# Patient Record
Sex: Male | Born: 1945
Health system: Southern US, Community
[De-identification: ages and names within clinical notes are randomized; demographics above are authoritative.]

## PROBLEM LIST (undated history)

## (undated) DIAGNOSIS — R972 Elevated prostate specific antigen [PSA]: Secondary | ICD-10-CM

## (undated) DIAGNOSIS — E079 Disorder of thyroid, unspecified: Secondary | ICD-10-CM

## (undated) DIAGNOSIS — G4700 Insomnia, unspecified: Secondary | ICD-10-CM

## (undated) DIAGNOSIS — T7840XA Allergy, unspecified, initial encounter: Secondary | ICD-10-CM

## (undated) DIAGNOSIS — E785 Hyperlipidemia, unspecified: Secondary | ICD-10-CM

## (undated) DIAGNOSIS — K900 Celiac disease: Secondary | ICD-10-CM

## (undated) DIAGNOSIS — J45909 Unspecified asthma, uncomplicated: Secondary | ICD-10-CM

## (undated) DIAGNOSIS — I10 Essential (primary) hypertension: Secondary | ICD-10-CM

## (undated) DIAGNOSIS — N189 Chronic kidney disease, unspecified: Secondary | ICD-10-CM

## (undated) HISTORY — DX: Allergy, unspecified, initial encounter: T78.40XA

## (undated) HISTORY — DX: Celiac disease: K90.0

## (undated) HISTORY — DX: Hyperlipidemia, unspecified: E78.5

## (undated) HISTORY — PX: COLONOSCOPY: SHX174

## (undated) HISTORY — DX: Disorder of thyroid, unspecified: E07.9

## (undated) HISTORY — DX: Essential (primary) hypertension: I10

## (undated) HISTORY — PX: EYE SURGERY: SHX253

## (undated) HISTORY — PX: UPPER GASTROINTESTINAL ENDOSCOPY: SHX188

## (undated) HISTORY — DX: Unspecified asthma, uncomplicated: J45.909

## (undated) HISTORY — DX: Chronic kidney disease, unspecified: N18.9

## (undated) HISTORY — DX: Insomnia, unspecified: G47.00

## (undated) HISTORY — DX: Elevated prostate specific antigen (PSA): R97.20

---

## 2004-03-13 ENCOUNTER — Encounter: Payer: Self-pay | Admitting: Gastroenterology

## 2005-05-28 ENCOUNTER — Ambulatory Visit: Payer: Self-pay | Admitting: Gastroenterology

## 2005-06-03 ENCOUNTER — Ambulatory Visit (HOSPITAL_COMMUNITY): Admission: RE | Admit: 2005-06-03 | Discharge: 2005-06-03 | Payer: Self-pay | Admitting: Gastroenterology

## 2005-06-03 ENCOUNTER — Ambulatory Visit: Payer: Self-pay | Admitting: Gastroenterology

## 2005-06-17 ENCOUNTER — Encounter: Payer: Self-pay | Admitting: Gastroenterology

## 2005-06-17 ENCOUNTER — Ambulatory Visit (HOSPITAL_COMMUNITY): Admission: RE | Admit: 2005-06-17 | Discharge: 2005-06-17 | Payer: Self-pay | Admitting: Gastroenterology

## 2005-07-08 ENCOUNTER — Ambulatory Visit: Payer: Self-pay | Admitting: Gastroenterology

## 2005-08-19 ENCOUNTER — Ambulatory Visit: Payer: Self-pay | Admitting: Gastroenterology

## 2006-09-23 ENCOUNTER — Ambulatory Visit: Payer: Self-pay | Admitting: Gastroenterology

## 2007-05-26 ENCOUNTER — Ambulatory Visit: Payer: Self-pay | Admitting: Gastroenterology

## 2007-06-02 ENCOUNTER — Encounter: Payer: Self-pay | Admitting: Gastroenterology

## 2007-06-02 ENCOUNTER — Ambulatory Visit: Payer: Self-pay | Admitting: Gastroenterology

## 2007-06-02 DIAGNOSIS — K2980 Duodenitis without bleeding: Secondary | ICD-10-CM | POA: Insufficient documentation

## 2007-07-07 ENCOUNTER — Ambulatory Visit: Payer: Self-pay | Admitting: Gastroenterology

## 2008-04-18 DIAGNOSIS — D509 Iron deficiency anemia, unspecified: Secondary | ICD-10-CM

## 2008-04-18 DIAGNOSIS — K900 Celiac disease: Secondary | ICD-10-CM | POA: Insufficient documentation

## 2011-04-08 ENCOUNTER — Encounter: Payer: Self-pay | Admitting: Gastroenterology

## 2011-05-14 NOTE — Assessment & Plan Note (Signed)
[facility]                         GASTROENTEROLOGY OFFICE NOTE   NAME:Vanduzer, KIANTE CIAVARELLA                       MRN:          [id number]  DATE:07/07/2007                            DOB:          [birth year]    PROBLEM:  Celiac disease.   Mr. Sawin has returned for his scheduled followup.  Celiac disease was  diagnosed by small bowel biopsies and by serologies. Mr. Klaiber does  admit to having some loose stools.  He has an iron-deficiency anemia  which clearly would be explained by his Celiac disease.   On exam, pulse 80, blood pressure 110/68, weight 125.   IMPRESSION:  1. Celiac sprue.  2. Iron-deficiency  anemia - secondary to number 1.   RECOMMENDATIONS:  Celiac disease was explained to Mr. Cessna and he was  given literature on this subject and on a gluten-free diet.  I will  followup with CBC in approximately 6 weeks.     Barbette Hair. Arlyce Dice, MD,FACG  Electronically Signed    RDK/MedQ  DD: 07/07/2007  DT: 07/07/2007  Job #: [id number]   cc:   Brett Canales A. Cleta Alberts, M.D.  Tera Mater. Evlyn Kanner, M.D.

## 2011-05-17 NOTE — Assessment & Plan Note (Signed)
[facility]                           GASTROENTEROLOGY OFFICE NOTE   NAME:Shawn Santos, Shawn Santos                       MRN:          [id number]  DATE:09/23/2006                            DOB:          Feb 19, 1946    PROBLEM:  Probable celiac disease.   HISTORY:  Mr. Semper returned for scheduled GI followup. Workup for iron-  deficiency anemia a year ago raised the question for celiac disease. Changes  by capsule endoscopy suggested celiac disease. His tissue transglutaminase  antibody was normal, though his antigliaden antibody was elevated, both IgG  and IgA. He has iron-deficiency anemia. He has no GI complaints including  abdominal bloating or diarrhea. Colonoscopy was negative. Small-bowel  biopsies were recommended a year ago, but he did not have that done.   He is no medications.   PHYSICAL EXAMINATION:  VITAL SIGNS:  Pulse 86, blood pressure 118/62, weight  123.   IMPRESSION:  Iron-deficiency anemia - probably secondary to celiac disease  with malabsorption.   RECOMMENDATIONS:  The patient is now agreeable to undergo endoscopy with  small-bowel biopsies. Accordingly, I will proceed.                                   Barbette Hair. Arlyce Dice, MD,FACG   RDK/MedQ  DD:  09/23/2006  DT:  09/24/2006  Job #:  [id number]   cc:   Brett Canales A. Cleta Alberts, M.D.  Tera Mater. Evlyn Kanner, M.D.

## 2012-01-13 ENCOUNTER — Ambulatory Visit: Payer: Self-pay | Admitting: Internal Medicine

## 2012-04-06 ENCOUNTER — Telehealth: Payer: Self-pay

## 2012-04-06 MED ORDER — ALPRAZOLAM 1 MG PO TABS: 0.5000 mg | ORAL_TABLET | Freq: Every evening | ORAL | Status: AC | PRN

## 2012-04-06 NOTE — Telephone Encounter (Signed)
rx faxed to pharmacy

## 2012-04-06 NOTE — Telephone Encounter (Signed)
JAMES FROM [facility] [address] CALLING IN A REFILL REQUEST ON PT'S XANAX .1MG S PLEASE CALL [phone]      [facility] [address]

## 2012-04-06 NOTE — Telephone Encounter (Signed)
Signed at TL desk.  

## 2012-04-13 ENCOUNTER — Encounter: Payer: Self-pay | Admitting: Internal Medicine

## 2012-06-02 ENCOUNTER — Other Ambulatory Visit: Payer: Self-pay | Admitting: *Deleted

## 2012-06-03 ENCOUNTER — Other Ambulatory Visit: Payer: Self-pay | Admitting: Physician Assistant

## 2012-06-03 MED ORDER — ALPRAZOLAM 1 MG PO TABS: ORAL_TABLET | ORAL | Status: DC

## 2012-06-30 ENCOUNTER — Other Ambulatory Visit: Payer: Self-pay | Admitting: Internal Medicine

## 2012-06-30 NOTE — Telephone Encounter (Signed)
Needs office visit.

## 2012-07-13 ENCOUNTER — Encounter: Payer: Self-pay | Admitting: Internal Medicine

## 2012-07-27 ENCOUNTER — Encounter: Payer: Self-pay | Admitting: Internal Medicine

## 2012-07-27 ENCOUNTER — Ambulatory Visit (INDEPENDENT_AMBULATORY_CARE_PROVIDER_SITE_OTHER): Payer: BC Managed Care – PPO | Admitting: Internal Medicine

## 2012-07-27 VITALS — BP 118/90 | HR 63 | Temp 97.3°F | Resp 16 | Ht 65.5 in | Wt 130.6 lb

## 2012-07-27 DIAGNOSIS — G4700 Insomnia, unspecified: Secondary | ICD-10-CM

## 2012-07-27 DIAGNOSIS — R972 Elevated prostate specific antigen [PSA]: Secondary | ICD-10-CM

## 2012-07-27 DIAGNOSIS — I10 Essential (primary) hypertension: Secondary | ICD-10-CM

## 2012-07-27 DIAGNOSIS — Z79899 Other long term (current) drug therapy: Secondary | ICD-10-CM

## 2012-07-27 DIAGNOSIS — Z0000 Encounter for general adult medical examination without abnormal findings: Secondary | ICD-10-CM

## 2012-07-27 DIAGNOSIS — E039 Hypothyroidism, unspecified: Secondary | ICD-10-CM

## 2012-07-27 DIAGNOSIS — E782 Mixed hyperlipidemia: Secondary | ICD-10-CM

## 2012-07-27 LAB — COMPREHENSIVE METABOLIC PANEL
AST: 22 U/L (ref 0–37)
Albumin: 4.6 g/dL (ref 3.5–5.2)
BUN: 21 mg/dL (ref 6–23)
CO2: 27 mEq/L (ref 19–32)
Calcium: 9.5 mg/dL (ref 8.4–10.5)
Chloride: 103 mEq/L (ref 96–112)
Glucose, Bld: 86 mg/dL (ref 70–99)
Potassium: 4.4 mEq/L (ref 3.5–5.3)

## 2012-07-27 LAB — LIPID PANEL
LDL Cholesterol: 135 mg/dL — ABNORMAL HIGH (ref 0–99)
Triglycerides: 81 mg/dL (ref <150)

## 2012-07-27 LAB — CBC WITH DIFFERENTIAL/PLATELET
Basophils Relative: 1 % (ref 0–1)
Eosinophils Absolute: 2 10*3/uL — ABNORMAL HIGH (ref 0.0–0.7)
Eosinophils Relative: 21 % — ABNORMAL HIGH (ref 0–5)
Hemoglobin: 16.8 g/dL (ref 13.0–17.0)
Lymphs Abs: 1.8 10*3/uL (ref 0.7–4.0)
MCH: 30.9 pg (ref 26.0–34.0)
MCHC: 34.6 g/dL (ref 30.0–36.0)
MCV: 89.5 fL (ref 78.0–100.0)
Monocytes Absolute: 0.6 10*3/uL (ref 0.1–1.0)
Monocytes Relative: 6 % (ref 3–12)
RBC: 5.43 MIL/uL (ref 4.22–5.81)

## 2012-07-27 LAB — POCT URINALYSIS DIPSTICK
Bilirubin, UA: NEGATIVE
Glucose, UA: NEGATIVE
Nitrite, UA: NEGATIVE
Spec Grav, UA: 1.015
Urobilinogen, UA: 0.2

## 2012-07-27 LAB — TSH: TSH: 2.432 u[IU]/mL (ref 0.350–4.500)

## 2012-07-27 MED ORDER — ZOLPIDEM TARTRATE 10 MG PO TABS: 10.0000 mg | ORAL_TABLET | Freq: Every evening | ORAL | Status: DC | PRN

## 2012-07-27 NOTE — Patient Instructions (Addendum)

## 2012-07-27 NOTE — Progress Notes (Signed)
  Subjective:    Patient ID: Shawn Santos, male    DOB: [birth year], 66 y.o.   MRN: [id number]  HPI HTN and low thy controlled. Insomnia an ongoing problem. Counseled and we both agree to dc alprazolam. Will start zolpidem. See survey   Review of Systems See survey ros    Objective:   Physical Exam Few actinics  Head to toes nl otherwise   ekg stable    Assessment & Plan:  RF to derm, Dr. Jorja Loa Start zolpidem RF all meds 1 yr

## 2012-07-28 NOTE — Progress Notes (Signed)
Completed PA over the phone and received approval through 07/28/13. Faxed approval notice to pharmacy.

## 2012-07-30 ENCOUNTER — Encounter: Payer: Self-pay | Admitting: Radiology

## 2012-10-02 ENCOUNTER — Ambulatory Visit (INDEPENDENT_AMBULATORY_CARE_PROVIDER_SITE_OTHER): Payer: BC Managed Care – PPO | Admitting: Internal Medicine

## 2012-10-02 VITALS — BP 138/84 | HR 103 | Temp 97.8°F | Resp 16 | Ht 65.5 in | Wt 135.0 lb

## 2012-10-02 DIAGNOSIS — S139XXA Sprain of joints and ligaments of unspecified parts of neck, initial encounter: Secondary | ICD-10-CM

## 2012-10-02 DIAGNOSIS — M542 Cervicalgia: Secondary | ICD-10-CM

## 2012-10-02 DIAGNOSIS — S161XXA Strain of muscle, fascia and tendon at neck level, initial encounter: Secondary | ICD-10-CM

## 2012-10-02 MED ORDER — PREDNISONE 10 MG PO TABS: ORAL_TABLET | ORAL | Status: DC

## 2012-10-02 MED ORDER — METHOCARBAMOL 750 MG PO TABS: 750.0000 mg | ORAL_TABLET | Freq: Four times a day (QID) | ORAL | Status: DC

## 2012-10-02 NOTE — Progress Notes (Signed)
  Subjective:    Patient ID: Shawn Santos, male    DOB: Apr 09, 1946, 66 y.o.   MRN: [id number]  HPI 2d of neck pain on the left. Painfull to rotate, no radiation. No chest pain or fatigue, sob. No weakness, numbness.   Review of Systems     Objective:   Physical Exam Normal Neck rom good with minor pain NMS intact       Assessment & Plan:  Neck strain/neck pain Robaxin/prednisone Neck manual

## 2012-10-06 ENCOUNTER — Ambulatory Visit: Payer: BC Managed Care – PPO

## 2012-10-06 ENCOUNTER — Ambulatory Visit (INDEPENDENT_AMBULATORY_CARE_PROVIDER_SITE_OTHER): Payer: BC Managed Care – PPO | Admitting: Internal Medicine

## 2012-10-06 VITALS — BP 124/80 | HR 81 | Temp 97.9°F | Resp 20 | Ht 64.25 in | Wt 134.0 lb

## 2012-10-06 DIAGNOSIS — M542 Cervicalgia: Secondary | ICD-10-CM

## 2012-10-06 DIAGNOSIS — G4700 Insomnia, unspecified: Secondary | ICD-10-CM

## 2012-10-06 DIAGNOSIS — S139XXA Sprain of joints and ligaments of unspecified parts of neck, initial encounter: Secondary | ICD-10-CM

## 2012-10-06 MED ORDER — ZOLPIDEM TARTRATE 10 MG PO TABS: 10.0000 mg | ORAL_TABLET | Freq: Every evening | ORAL | Status: AC | PRN

## 2012-10-06 MED ORDER — HYDROCODONE-ACETAMINOPHEN 5-500 MG PO TABS: 1.0000 | ORAL_TABLET | Freq: Three times a day (TID) | ORAL | Status: DC | PRN

## 2012-10-06 NOTE — Patient Instructions (Signed)
Place neck pain patient instructions here.  

## 2012-10-06 NOTE — Progress Notes (Signed)
  Subjective:    Patient ID: Shawn Santos, male    DOB: 1946-09-05, 66 y.o.   MRN: [id number]  HPI Neck pain got better then worse again. No radiation, weakness numbness   Review of Systems     Objective:   Physical Exam  Constitutional: He is oriented to person, place, and time. He appears well-nourished. No distress.  Neck: Neck supple.  Pulmonary/Chest: Effort normal.  Musculoskeletal: He exhibits tenderness.       Cervical back: He exhibits decreased range of motion, tenderness and spasm. He exhibits no swelling and no deformity.       Back:       X=pain  Neurological: He is alert and oriented to person, place, and time. He has normal reflexes. No cranial nerve deficit. He exhibits normal muscle tone. Coordination normal.  Skin: Skin is warm and dry. No rash noted.  Psychiatric: He has a normal mood and affect.   [facility] reading (PRIMARY) by  Dr.Guest mid degenerative changes,facet joints        Assessment & Plan:  Neck pain

## 2012-10-12 ENCOUNTER — Other Ambulatory Visit: Payer: Self-pay | Admitting: Physician Assistant

## 2012-11-19 ENCOUNTER — Ambulatory Visit (INDEPENDENT_AMBULATORY_CARE_PROVIDER_SITE_OTHER): Payer: BC Managed Care – PPO | Admitting: Emergency Medicine

## 2012-11-19 ENCOUNTER — Ambulatory Visit: Payer: BC Managed Care – PPO

## 2012-11-19 VITALS — BP 134/80 | HR 91 | Temp 97.5°F | Resp 16 | Ht 66.0 in | Wt 137.0 lb

## 2012-11-19 DIAGNOSIS — R609 Edema, unspecified: Secondary | ICD-10-CM

## 2012-11-19 DIAGNOSIS — R140 Abdominal distension (gaseous): Secondary | ICD-10-CM

## 2012-11-19 DIAGNOSIS — E079 Disorder of thyroid, unspecified: Secondary | ICD-10-CM

## 2012-11-19 DIAGNOSIS — R809 Proteinuria, unspecified: Secondary | ICD-10-CM

## 2012-11-19 DIAGNOSIS — R142 Eructation: Secondary | ICD-10-CM

## 2012-11-19 LAB — POCT CBC
HCT, POC: 50.9 % (ref 43.5–53.7)
Lymph, poc: 2.2 (ref 0.6–3.4)
MCH, POC: 30.6 pg (ref 27–31.2)
MCHC: 31.2 g/dL — AB (ref 31.8–35.4)
MCV: 98.1 fL — AB (ref 80–97)
MID (cbc): 1.4 — AB (ref 0–0.9)
POC LYMPH PERCENT: 20.8 %L (ref 10–50)
Platelet Count, POC: 320 10*3/uL (ref 142–424)
RDW, POC: 13.6 %
WBC: 10.4 10*3/uL — AB (ref 4.6–10.2)

## 2012-11-19 LAB — COMPREHENSIVE METABOLIC PANEL
ALT: 20 U/L (ref 0–53)
Alkaline Phosphatase: 64 U/L (ref 39–117)
CO2: 29 mEq/L (ref 19–32)
Creat: 1.11 mg/dL (ref 0.50–1.35)
Glucose, Bld: 86 mg/dL (ref 70–99)
Total Bilirubin: 0.2 mg/dL — ABNORMAL LOW (ref 0.3–1.2)

## 2012-11-19 LAB — POCT URINALYSIS DIPSTICK
Protein, UA: >=300
Spec Grav, UA: >=1.03
pH, UA: 7

## 2012-11-19 LAB — TSH: TSH: 7.418 u[IU]/mL — ABNORMAL HIGH (ref 0.350–4.500)

## 2012-11-19 LAB — POCT UA - MICROSCOPIC ONLY
Crystals, Ur, HPF, POC: NEGATIVE
Mucus, UA: NEGATIVE
Yeast, UA: NEGATIVE

## 2012-11-19 NOTE — Progress Notes (Signed)
Subjective:    Patient ID: Shawn Santos, male    DOB: 1946-04-29, 66 y.o.   MRN: [id number]  HPI chief complaint of feeling bloated. He's had no cardiac symptoms of chest pain shortness of breath. He does feel like he's puffy in his lower extremities but has no real edema of his ankles. Nausea or vomiting he isn't having normal bowel movements he sees a urologist and was last seen about 2 months ago for that he feels like his urinary frequency has diminished. He states he only has a urge to urinate about 2 times a day.    Review of Systems     Objective:   Physical Exam HEENT exam is unremarkable. Neck is supple. Chest is clear to auscultation and percussion. Cardiac exam reveals regular rate no murmurs rubs or gallops. Abdominal exam reveals a flat abdomen bowel sounds are normal. There are no areas of tenderness.  [facility] reading (PRIMARY) by  Dr.Dodi Leu acute abdominal series reveals no acute findings no free air no evidence of obstruction appears to be a lot of stool and gas   Results for orders placed in visit on 11/19/12  POCT CBC      Component Value Range   WBC 10.4 (*) 4.6 - 10.2 K/uL   Lymph, poc 2.2  0.6 - 3.4   POC LYMPH PERCENT 20.8  10 - 50 %L   MID (cbc) 1.4 (*) 0 - 0.9   POC MID % 13.3 (*) 0 - 12 %M   POC Granulocyte 6.9  2 - 6.9   Granulocyte percent 65.9  37 - 80 %G   RBC 5.19  4.69 - 6.13 M/uL   Hemoglobin 15.9  14.1 - 18.1 g/dL   HCT, POC 78.2  95.6 - 53.7 %   MCV 98.1 (*) 80 - 97 fL   MCH, POC 30.6  27 - 31.2 pg   MCHC 31.2 (*) 31.8 - 35.4 g/dL   RDW, POC 21.3     Platelet Count, POC 320  142 - 424 K/uL   MPV 8.5  0 - 99.8 fL  POCT URINALYSIS DIPSTICK      Component Value Range   Color, UA yellow     Clarity, UA clear     Glucose, UA neg     Bilirubin, UA neg     Ketones, UA trace     Spec Grav, UA >=1.030     Blood, UA moderate     pH, UA 7.0     Protein, UA >=300     Urobilinogen, UA 0.2     Nitrite, UA neg     Leukocytes, UA Negative    POCT UA -  MICROSCOPIC ONLY      Component Value Range   WBC, Ur, HPF, POC 0-1     RBC, urine, microscopic 2-4     Bacteria, U Microscopic neg     Mucus, UA neg     Epithelial cells, urine per micros 0-2     Crystals, Ur, HPF, POC neg     Casts, Ur, LPF, POC neg     Yeast, UA neg          Assessment & Plan:  Patient with complaints of abdominal bloating with decreased urinary frequency and a past history of sprue. He states he is up-to-date on his colonoscopies. We'll proceed with baseline labs as well as an abdominal series. His urine does show significant proteinuria. We'll start with an ultrasound of the kidneys he may  need nephrology evaluation for his proteinuria. He is a regular patient of Dr. Retta Diones had his and last  check in August. His white blood count is up slightly. But is not have a fever does not feel ill .

## 2012-11-21 ENCOUNTER — Other Ambulatory Visit: Payer: Self-pay | Admitting: Family Medicine

## 2012-11-21 MED ORDER — LEVOTHYROXINE SODIUM 100 MCG PO TABS: 100.0000 ug | ORAL_TABLET | Freq: Every day | ORAL | Status: DC

## 2012-12-14 ENCOUNTER — Ambulatory Visit
Admission: RE | Admit: 2012-12-14 | Discharge: 2012-12-14 | Disposition: A | Discharge status: Home / Self Care | Payer: BC Managed Care – PPO | Source: Ambulatory Visit | Attending: Emergency Medicine | Admitting: Emergency Medicine

## 2012-12-14 DIAGNOSIS — R809 Proteinuria, unspecified: Secondary | ICD-10-CM

## 2013-01-11 ENCOUNTER — Other Ambulatory Visit: Payer: Self-pay | Admitting: Physician Assistant

## 2013-01-12 ENCOUNTER — Other Ambulatory Visit: Payer: Self-pay | Admitting: Physician Assistant

## 2013-02-01 ENCOUNTER — Ambulatory Visit (INDEPENDENT_AMBULATORY_CARE_PROVIDER_SITE_OTHER): Payer: BC Managed Care – PPO | Admitting: Internal Medicine

## 2013-02-01 ENCOUNTER — Encounter: Payer: Self-pay | Admitting: Internal Medicine

## 2013-02-01 VITALS — BP 128/62 | HR 79 | Temp 97.7°F | Resp 16 | Ht 65.0 in | Wt 129.4 lb

## 2013-02-01 DIAGNOSIS — E039 Hypothyroidism, unspecified: Secondary | ICD-10-CM

## 2013-02-01 DIAGNOSIS — G4700 Insomnia, unspecified: Secondary | ICD-10-CM

## 2013-02-01 DIAGNOSIS — Z7689 Persons encountering health services in other specified circumstances: Secondary | ICD-10-CM

## 2013-02-01 DIAGNOSIS — R972 Elevated prostate specific antigen [PSA]: Secondary | ICD-10-CM | POA: Insufficient documentation

## 2013-02-01 DIAGNOSIS — I10 Essential (primary) hypertension: Secondary | ICD-10-CM

## 2013-02-01 DIAGNOSIS — Z0189 Encounter for other specified special examinations: Secondary | ICD-10-CM

## 2013-02-01 DIAGNOSIS — R319 Hematuria, unspecified: Secondary | ICD-10-CM

## 2013-02-01 LAB — COMPREHENSIVE METABOLIC PANEL
Alkaline Phosphatase: 68 U/L (ref 39–117)
BUN: 20 mg/dL (ref 6–23)
CO2: 22 mEq/L (ref 19–32)
Creat: 1.1 mg/dL (ref 0.50–1.35)
Glucose, Bld: 93 mg/dL (ref 70–99)
Sodium: 140 mEq/L (ref 135–145)
Total Bilirubin: 0.7 mg/dL (ref 0.3–1.2)

## 2013-02-01 LAB — POCT UA - MICROSCOPIC ONLY
Casts, Ur, LPF, POC: NEGATIVE
Yeast, UA: NEGATIVE

## 2013-02-01 LAB — POCT URINALYSIS DIPSTICK
Ketones, UA: NEGATIVE
Protein, UA: NEGATIVE
Spec Grav, UA: 1.015
Urobilinogen, UA: 0.2
pH, UA: 5.5

## 2013-02-01 NOTE — Patient Instructions (Signed)
Celiac Lactose-Free Diet Lactose is a carbohydrate that is found mainly in milk and milk products, as well as in foods with added milk or whey. Lactose must be digested by the enzyme lactase in order to be used by the body. Lactose intolerance occurs when there is a shortage of lactase. When your body is not able to digest lactose, you may feel sick to your stomach (nausea), bloated, and have cramps, gas, and diarrhea. TYPES OF LACTASE DEFICIENCY  Primary lactase deficiency. This is the most common type. It is characterized by a slow decrease in lactase activity.  Secondary lactase deficiency. This occurs following injury to the small intestinal mucosa as a result of a disease or condition. It can also occur as a result of surgery or after treatment with antibiotic medicines or cancer drugs. Tolerance to lactose varies widely. Each person must determine how much milk can be consumed without developing symptoms. Drinking smaller portions of milk throughout the day may be helpful. Some studies suggest that slowing gastric emptying may help increase tolerance of milk products. This may be done by:  Consuming milk or milk products with a meal rather than alone.  Consuming milk with a higher fat content. There are many dairy products that may be tolerated better than milk by some people, including:  Cheese (especially aged cheese). The lactose content is much lower than in milk.  Cultured dairy products, such as yogurt, buttermilk, cottage cheese, and sweet acidophilus milk (kefir). These products are usually well tolerated by lactase-deficient people. This is because the healthy bacteria help digest lactose.  Lactose-hydrolyzed milk. This product contains 40% to 90% less lactose than milk and may also be well tolerated. ADEQUACY These diets may be deficient in calcium, riboflavin, and vitamin D, according to the Recommended Dietary Allowances of the [employer]. Depending on individual  tolerances and the use of milk substitutes, milk, or other dairy products, you may be able to meet these recommendations. SPECIAL NOTES  Lactose is a carbohydrate. The main food source for lactose is dairy products. Reading food labels is important. Many products contain lactose even when they are not made from milk. Look for the following words: whey, milk solids, dry milk solids, nonfat dry milk powder. Typical sources of lactose other than dairy products include breads, candies, cold cuts, prepared and processed foods, and commercial sauces and gravies.  All foods must be prepared without milk, cream, or other dairy foods.  A vitamin or mineral supplement may be necessary. Consult your caregiver or Registered Dietitian.  Lactose is also found in many prescription and over-the-counter medicines.  Soy milk and lactose-free supplements may be used as an alternative to milk. CHOOSING FOODS Breads and Starches  Allowed: Breads and rolls made without milk. [place], [place], or [place] bread. Soda crackers, graham crackers. Any crackers prepared without lactose. Cooked or dry cereals prepared without lactose (read labels). Any potatoes, pasta, or rice prepared without milk or lactose. Popcorn.  Avoid: Breads and rolls that contain milk. Prepared mixes such as muffins, biscuits, waffles, pancakes. Sweet rolls, donuts, [place] toast (if made with milk or lactose). Zwieback crackers, corn curls, or any crackers that contain lactose. Cooked or dry cereals prepared with lactose (read labels). Instant potatoes, frozen [place] fries, scalloped or au gratin potatoes. Vegetables  Allowed: Fresh, frozen, and canned vegetables.  Avoid: Creamed or breaded vegetables. Vegetables in a cheese sauce or with lactose-containing margarines. Fruit  Allowed: All fresh, canned, or frozen fruits that are not processed with lactose.  Avoid: Any canned or frozen fruits processed with lactose. Meat and Meat  Substitutes  Allowed: Plain beef, chicken, fish, [place], lamb, veal, pork, or ham. Kosher prepared meat products. Strained or junior meats that do not contain milk. Eggs, soy meat substitutes, nuts.  Avoid: Scrambled eggs, omelets, and souffles that contain milk. Creamed or breaded meat, fish, or fowl. Sausage products such as wieners, liver sausage, or cold cuts that contain milk solids. Cheese, cottage cheese, or cheese spreads. Milk  Allowed: None.  Avoid: Milk (whole, 2%, skim, or chocolate). Evaporated, powdered, or condensed milk. Malted milk. Soups and Combination Foods  Allowed: Bouillon, broth, vegetable soups, clear soups, consomms. Homemade soups made with allowed ingredients. Combination or prepared foods that do not contain milk or milk products (read labels).  Avoid: Cream soups, chowders, commercially prepared soups containing lactose. Macaroni and cheese, pizza. Combination or prepared foods that contain milk or milk products. Desserts and Sweets  Allowed: Water and fruit ices, gelatin, angel food cake. Homemade cookies, pies, or cakes made from allowed ingredients. Pudding (if made with water or a milk substitute). Lactose-free tofu desserts. Sugar, honey, corn syrup, jam, jelly, marmalade, molasses (beet sugar). Pure sugar candy, marshmallows.  Avoid: Ice cream, ice milk, sherbet, custard, pudding, frozen yogurt. Commercial cake and cookie mixes. Desserts that contain chocolate. Pie crust made with milk-containing margarine. Reduced calorie desserts made with a sugar substitute that contains lactose. Toffee, peppermint, butterscotch, chocolate, caramels. Fats and Oils  Allowed: Butter (as tolerated, contains very small amounts of lactose). Margarines and dressings that do not contain milk. Vegetable oils, shortening, mayonnaise, nondairy cream and whipped toppings without lactose or milk solids added. Tomasa Blase.  Avoid: Margarines and salad dressings containing milk. Cream,  cream cheese, peanut butter with added milk solids, sour cream, chip dips made with sour cream. Beverages  Allowed: Carbonated drinks, tea, coffee and freeze-dried coffee, some instant coffees (check labels). Fruit drinks, fruit and vegetable juice, rice or soy milk.  Avoid: Hot chocolate. Some cocoas, some instant coffees, instant iced teas, powdered fruit drinks (read labels). Condiments  Allowed: Soy sauce, carob powder, olives, gravy made with water, baker's cocoa, pickles, pure seasonings and spices, wine, pure monosodium glutamate, catsup, mustard.  Avoid: Some chewing gums, chocolate, some cocoas. Certain antibiotics and vitamin or mineral preparations. Spice blends if they contain milk products. MSG extender. Artificial sweeteners that contain lactose. Some nondairy creamers (read labels). SAMPLE MENU Breakfast  Orange juice.  Banana.  Bran cereal.  Nondairy creamer.  Vienna bread, toasted.  Butter or milk-free margarine.  Coffee or tea. Lunch  Chicken breast.  Rice.  Green beans.  Butter or milk-free margarine.  Fresh melon.  Coffee or tea. Dinner  [place].  Baked potato.  Butter or milk-free margarine.  Broccoli.  Lettuce salad with vinegar and oil dressing.  [employer].  Coffee or tea. Document Released: 06/07/2002 Document Revised: 03/09/2012 Document Reviewed: 03/15/2011 [facility] Patient Information 2013 [place], [place]. Celiac Disease Celiac disease is a digestive disease that causes your body's natural defense system (immune system) to react against its own cells. It interferes with taking in (absorbing) nutrients from food. Celiac disease is also known as celiac sprue, nontropical sprue, and gluten-sensitive enteropathy. People who have celiac disease cannot tolerate gluten. Gluten is a protein found in wheat, rye, and barley. With time, celiac disease will damage the cells lining the small intestine. This leads to being unable to  absorb nutrients from food (malabsorption), diarrhea, and nutritional problems. CAUSES  Celiac disease  is genetic. This means you have a higher likelihood of getting the disease if someone in your family has or has had it. Up to 10% of your close relatives (parent, sibling, child) may also have the disease.  People with celiac disease tend to have other autoimmune diseases. The link may be genetic. These diseases include:  Dermatitis herpetiformis.  Thyroid disease.  Systemic lupus erythematosis.  Type 1 diabetes.  Liver disease.  Collagen vascular disease.  Rheumatoid arthritis.  Sjgren's syndrome. SYMPTOMS  The symptoms of celiac disease vary from person to person. The symptoms are generally digestive or nutritional. Digestive symptoms include:  Recurring belly (abdominal) bloating and pain.  Gas.  Long-term (chronic) diarrhea.  Pale, bad-smelling, greasy, or oily stool. Nutritional symptoms include:  Failure to thrive in infants.  Delayed growth in children.  Weight loss in children and adults.  Missed menstrual periods (often due to extreme weight loss).  Anemia.  Weakening bones (osteoporosis).  Fatigue and weakness.  Tingling or other signs of nerve damage (peripheral neuropathy).  Depression. DIAGNOSIS  If your symptoms and physical exam suggest that a digestive disorder or malnutrition is present, your caregiver may suspect celiac disease. You may have already begun a gluten-free diet. If symptoms persist, testing may be needed to confirm the diagnosis. Some tests are best done while you are on a normal, unrestricted diet. Tests may include:  Blood tests to check for nutritional deficiencies.  Blood tests to look for evidence that the body is producing antibodies against its own small intestine cells.  Taking a tissue sample (biopsy) from the small bowel for evaluation.  X-rays of the small bowel.  Evaluating the stool for fat.  Tests to check  for nutrient absorption from the intestine. TREATMENT  It is important to seek treatment. Untreated celiac disease can cause growth problems (in children), anemia, osteoporosis, and possible nerve problems. A pregnant patient with untreated celiac disease has a higher risk of miscarriage, and the fetus has an increased risk of low birth weight and other growth problems. If celiac disease is diagnosed in the early stages, treatment can allow you to live a long, nearly symptom-free life. Treatment includes following a gluten-free diet. This means avoiding all foods that contain gluten. Eating even a small amount of gluten can damage your intestine. For most people, following this diet will stop symptoms. It will heal existing intestinal damage and prevent further damage. Improvements begin within days of starting the diet. The small intestine is usually completely healed within 3 to 6 months, or it may take up to 2 years for older adults. A small percentage of people do not improve on the gluten-free diet. Depending on your age and the stage at which you were diagnosed, some problems such as delayed growth and discolored teeth may not improve. Sometimes, damaged intestines cannot heal. If your intestines are not absorbing enough nutrients, you may need to receive nutrition supplements through an intravenous (IV) tube. Drug treatments are being tested for unresponsive celiac disease. In this case, you may need to be evaluated for complications of the disease. Your caregiver may also recommend:  A pneumonia vaccination.  Nutrients and other treatments for any nutritional deficiencies. Your caregiver can provide you with more information on a gluten-free diet. Discussion with a dietician skilled in this illness will be valuable. Support groups may also be helpful. HOME CARE INSTRUCTIONS   Focus on a gluten-free diet. This diet must become a way of life.  Monitor your response to the  gluten-free diet and  treat any nutritional deficiencies.  Prepare ahead of time if you decide to eat outside the home.  Make and keep your regular follow-up visits with your caregiver.  Suggest to family members that they get screened for early signs of the disease. SEEK MEDICAL CARE IF:   You continue to have digestive symptoms (gas, cramping, diarrhea) despite a proper diet.  You have trouble sticking to the gluten-free diet.  You develop an itchy rash with groups of tiny blisters.  You develop severe weakness, balance problems, menstrual problems, or depression. Document Released: 12/16/2005 Document Revised: 03/09/2012 Document Reviewed: 04/04/2010 [facility] Patient Information 2013 [place], [place].

## 2013-02-01 NOTE — Progress Notes (Signed)
  Subjective:    Patient ID: Shawn Santos, male    DOB: 10-20-1946, 67 y.o.   MRN: [id number]  HPI HTN controlled, low thyroid controlled.No anorexia and weight is stable. New issue of bloating, has coeliac followed by Dr. Arlyce Dice GI. Had few rbcs and small protein in urine with Dr. Cleta Alberts last visit. Has elevated PSA followed closely by Dr. Retta Diones. Is stable. Also has marked eosinophilia last 2 tests total 17 and 21  Review of Systems     Objective:   Physical Exam  Vitals reviewed. Constitutional: He is oriented to person, place, and time. He appears well-developed and well-nourished.  Eyes: EOM are normal. No scleral icterus.  Cardiovascular: Normal rate, regular rhythm and normal heart sounds.   Pulmonary/Chest: Breath sounds normal.  Abdominal: Soft. Bowel sounds are normal. He exhibits no distension and no mass.  Musculoskeletal: Normal range of motion.  Neurological: He is alert and oriented to person, place, and time. He exhibits normal muscle tone. Coordination normal.  Psychiatric: He has a normal mood and affect.   Results for orders placed in visit on 02/01/13  POCT UA - MICROSCOPIC ONLY      Component Value Range   WBC, Ur, HPF, POC 0-1     RBC, urine, microscopic neg     Bacteria, U Microscopic neg     Mucus, UA trace     Epithelial cells, urine per micros 0-2     Crystals, Ur, HPF, POC neg     Casts, Ur, LPF, POC neg     Yeast, UA neg    POCT URINALYSIS DIPSTICK      Component Value Range   Color, UA yellow     Clarity, UA clear     Glucose, UA neg     Bilirubin, UA neg     Ketones, UA neg     Spec Grav, UA 1.015     Blood, UA neg     pH, UA 5.5     Protein, UA neg     Urobilinogen, UA 0.2     Nitrite, UA neg     Leukocytes, UA Negative     Urine normal today with no blood and no protein.       Assessment & Plan:  Bloating only sx. Eosinophilia cause unclear/repeat today and if elevated refer to hematology. CPE 6 mos/RF meds 1 year

## 2013-02-02 LAB — EOSINOPHIL COUNT: Eosinophils Absolute: 1.3 10*3/uL — ABNORMAL HIGH (ref 0.0–0.7)

## 2013-04-13 ENCOUNTER — Other Ambulatory Visit: Payer: Self-pay | Admitting: Physician Assistant

## 2013-04-23 IMAGING — US US ABDOMEN COMPLETE
1 series · 13 of 25 positions shown · non-contrast
Comparison: None.

CLINICAL DATA: Proteinuria, abdominal bloating

COMPLETE ABDOMINAL ULTRASOUND

[Series 1: us abdomen complete · 0.24mm/px · 13 of 100 slices shown]
[im 1/100]
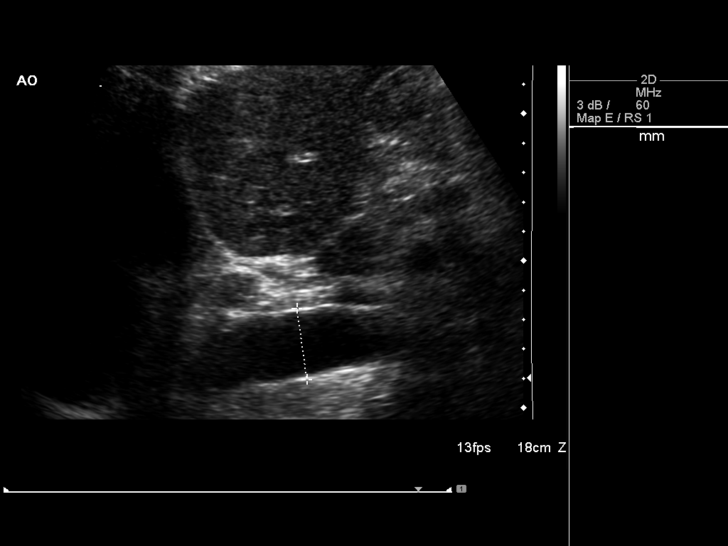
[im 9/100]
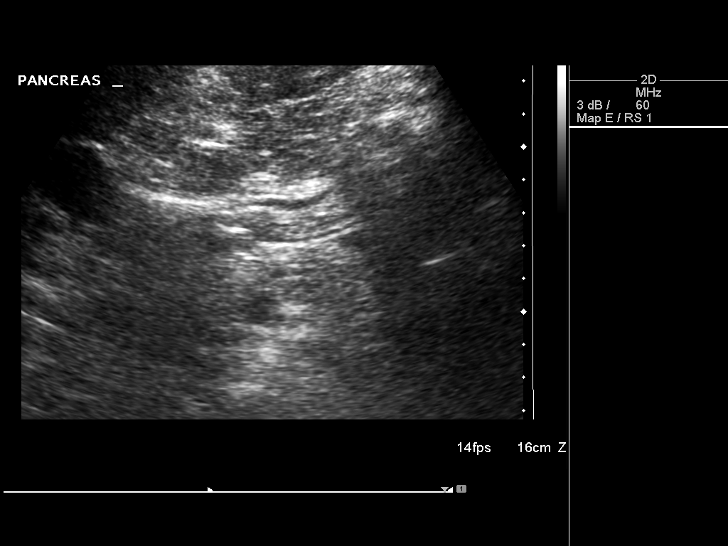
[im 17/100]
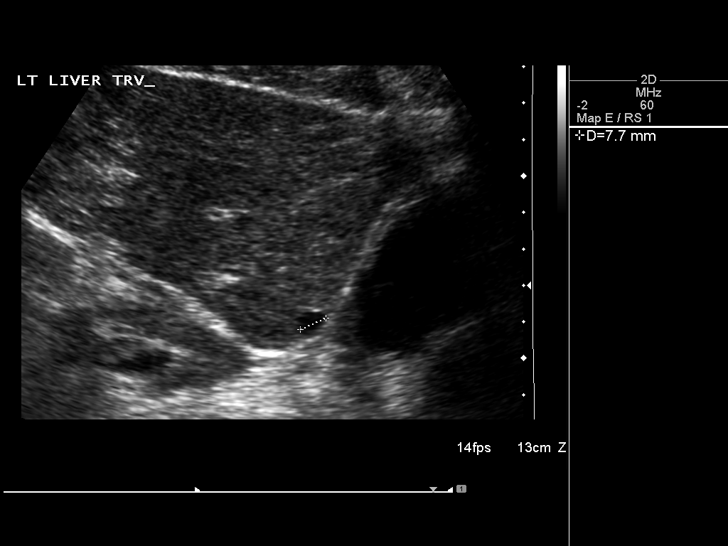
[im 25/100]
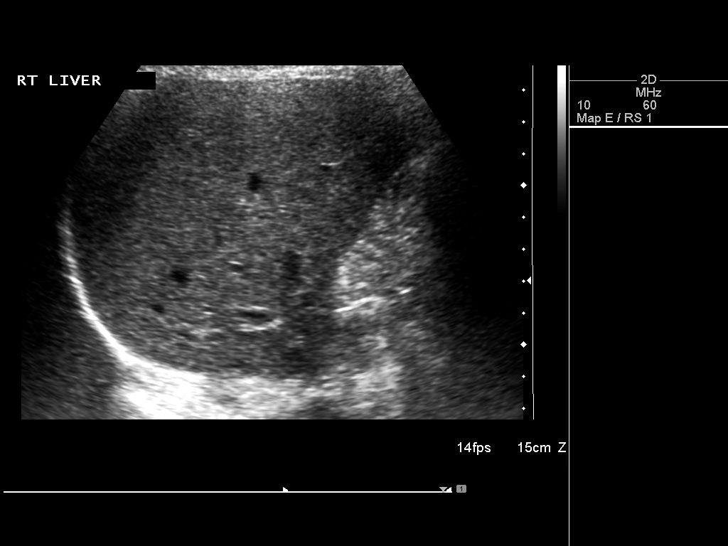
[im 34/100]
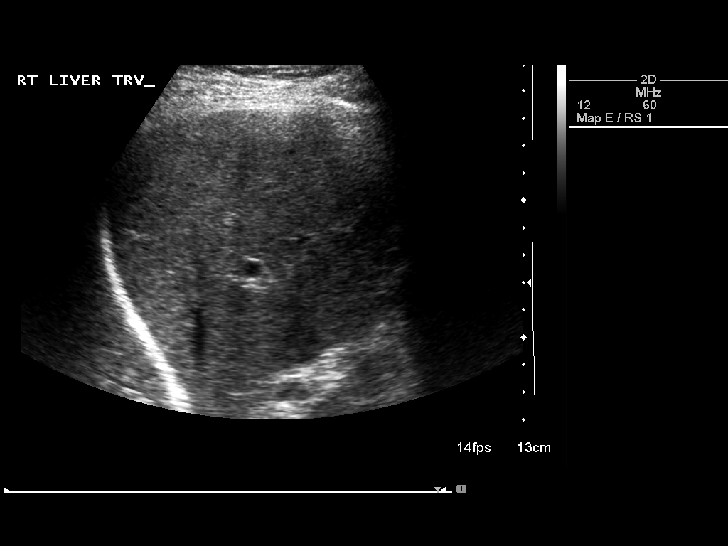
[im 42/100]
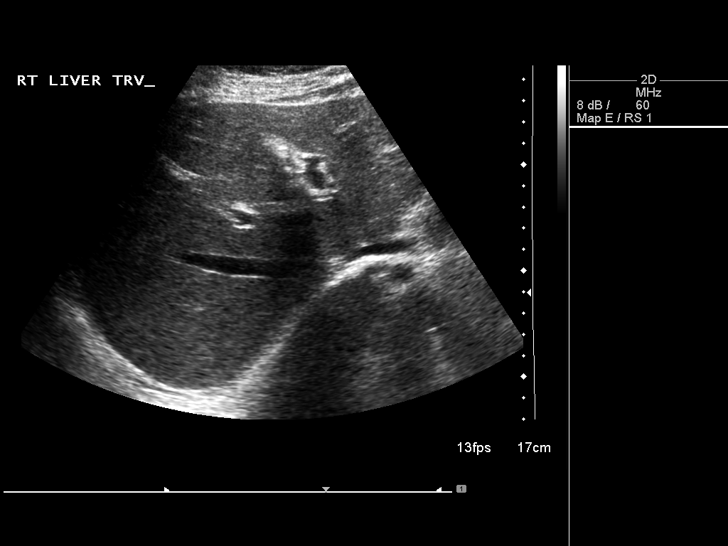
[im 50/100]
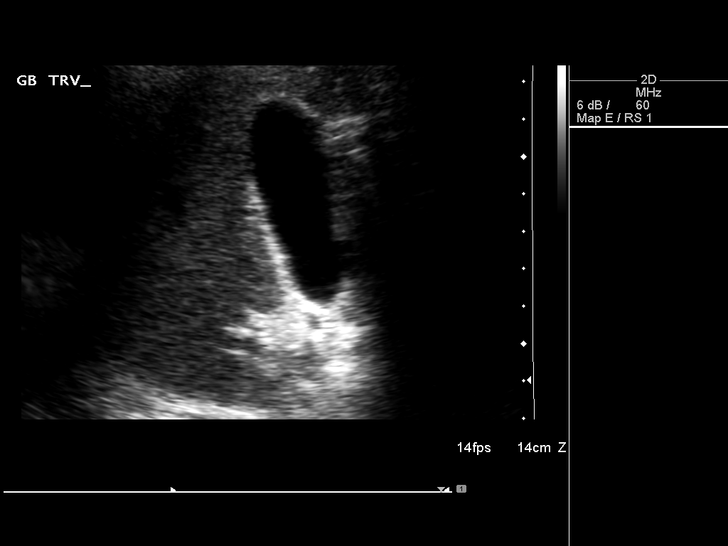
[im 58/100]
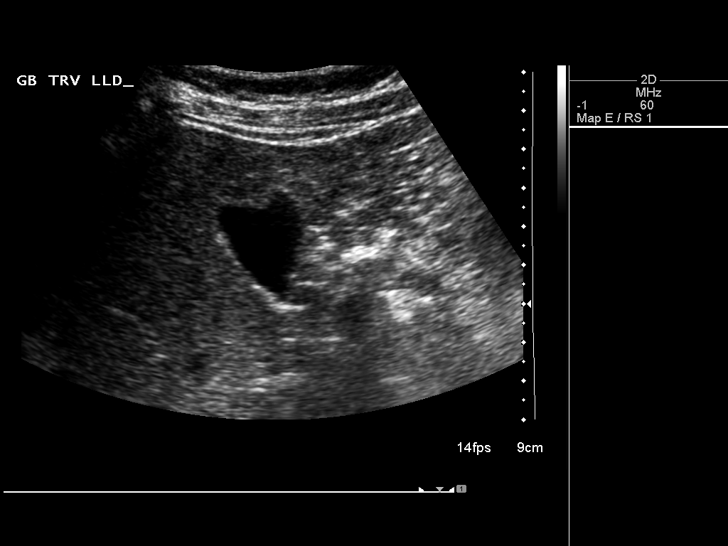
[im 67/100]
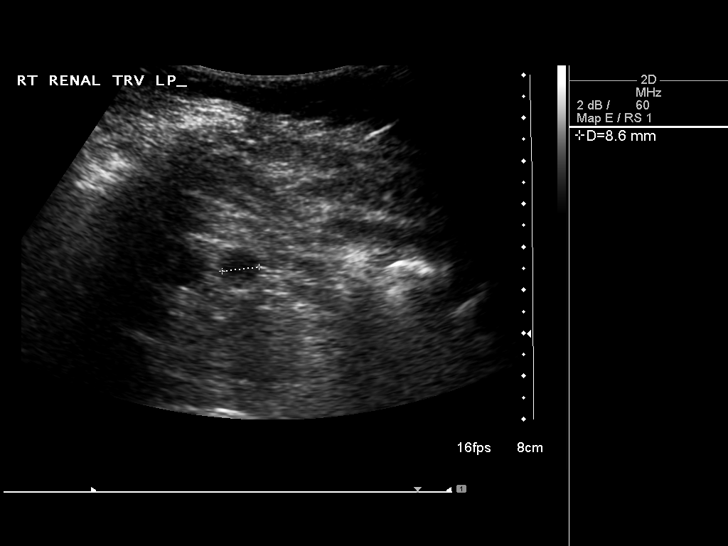
[im 75/100]
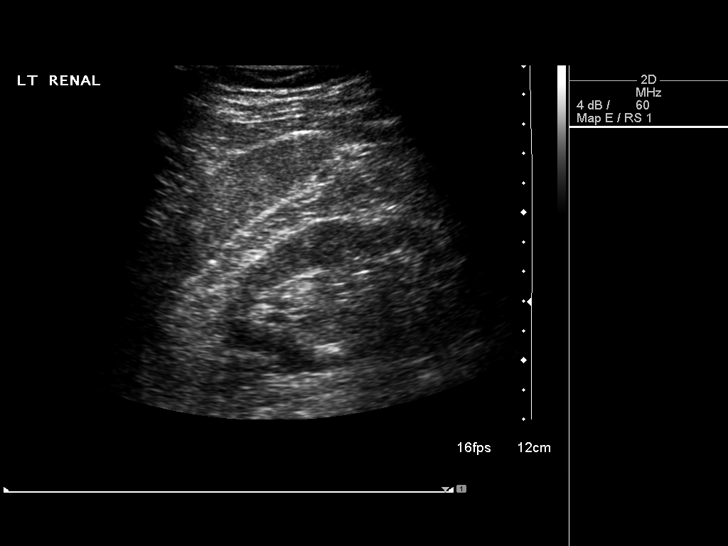
[im 83/100]
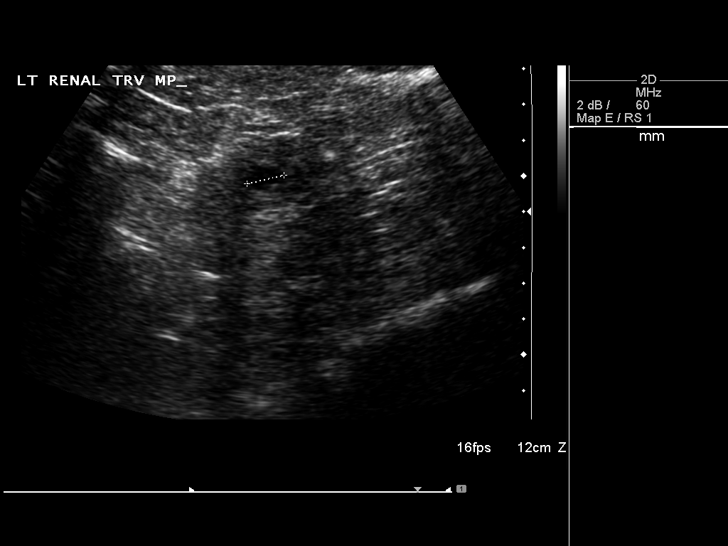
[im 91/100]
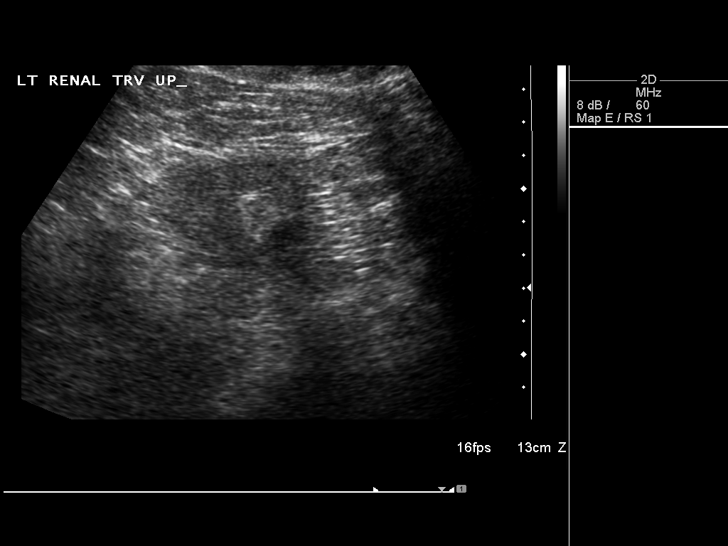
[im 100/100]
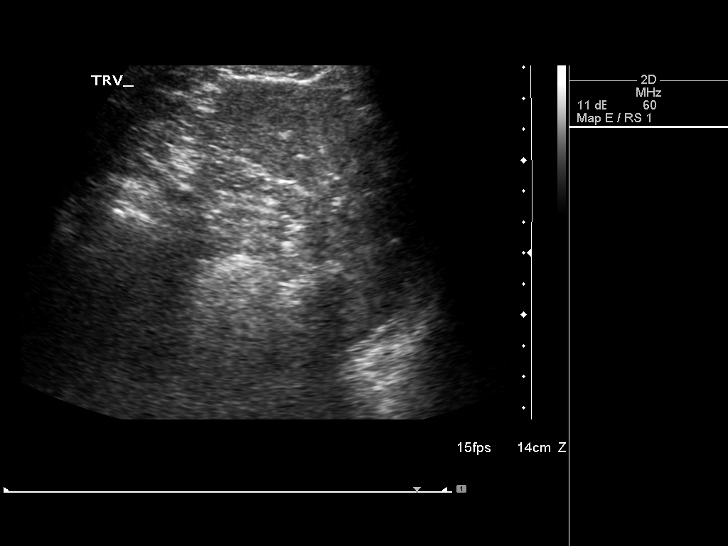

[13 of 25 positions shown; findings below may reference images not displayed]

FINDINGS: Gallbladder:  The gallbladder is visualized and no gallstones are
noted.  There is no pain over the gallbladder with compression.

Common bile duct:  The common bile duct is normal measuring 3.6 mm
in diameter.

Liver:  The liver has a normal echogenic pattern other than a
simple appearing cyst in the left lobe of 8 mm in diameter.

IVC:  The IVC is partially obscured by bowel gas.

Pancreas:  The pancreas also is largely obscured by bowel gas.

Spleen:  The spleen is normal measuring 6.8 cm sagittally.

Right Kidney:  No hydronephrosis is seen.  The right kidney
measures 12.2 cm sagittally.  Two lower poles cysts are present of
no more than 1.1 cm in diameter.The renal parenchyma may be
slightly echogenic.

Left Kidney:  No hydronephrosis is noted.  The left kidney measures
11.6 cm sagittally.  A minute left renal cyst present of 1.1 cm in
diameter.The renal parenchyma may be minimally echogenic.

Abdominal aorta:  The abdominal aorta is normal in caliber.
IMPRESSION: 1.  No gallstones.  No ductal dilatation.
2.  No hydronephrosis.  Small bilateral renal cysts are present.
3.  The pancreas is largely obscured by bowel gas.
4.  Slightly echogenic renal parenchyma.

## 2013-05-24 ENCOUNTER — Other Ambulatory Visit: Payer: Self-pay | Admitting: Emergency Medicine

## 2013-05-24 ENCOUNTER — Other Ambulatory Visit: Payer: Self-pay | Admitting: Internal Medicine

## 2013-05-24 NOTE — Telephone Encounter (Signed)
Dr Perrin Maltese do you want to RF?

## 2013-05-25 NOTE — Telephone Encounter (Signed)
ok 

## 2013-06-01 ENCOUNTER — Telehealth: Payer: Self-pay | Admitting: Radiology

## 2013-06-01 NOTE — Telephone Encounter (Signed)
Received refill request for Zolpidem, this is listed as historical (written by someone else), do you want to fill for him?

## 2013-06-04 NOTE — Telephone Encounter (Signed)
ok 

## 2013-06-07 MED ORDER — ZOLPIDEM TARTRATE 10 MG PO TABS: 10.0000 mg | ORAL_TABLET | Freq: Every evening | ORAL | Status: DC | PRN

## 2013-06-07 NOTE — Telephone Encounter (Signed)
ok 

## 2013-06-07 NOTE — Addendum Note (Signed)
Addended by: Sheppard Plumber A on: 06/07/2013 10:52 AM   Modules accepted: Orders

## 2013-06-07 NOTE — Telephone Encounter (Signed)
Called in Rx

## 2013-06-08 NOTE — Telephone Encounter (Signed)
Pharm sent another req for zolpidem RF. It looks like you did see the pt for insomnia in Feb.

## 2013-06-10 MED ORDER — ZOLPIDEM TARTRATE 10 MG PO TABS: 10.0000 mg | ORAL_TABLET | Freq: Every evening | ORAL | Status: DC | PRN

## 2013-06-10 NOTE — Telephone Encounter (Signed)
rf 6 mo

## 2013-06-10 NOTE — Telephone Encounter (Signed)
Called in refill for 6 months to [facility] [address]

## 2013-08-26 ENCOUNTER — Telehealth: Payer: Self-pay | Admitting: Radiology

## 2013-08-26 NOTE — Telephone Encounter (Signed)
The following email was submitted to your website from [place] no rush on this! just wondering when my next appointment with doctor Doctor Guest is scheduled(should be my yearly checkup-last saw him in February. Again, no rush. Shawn Santos Date of birth: 06/19/1946   Scheduling: Can you please follow up with this patient and let him know when his next appointment is? Thank you!

## 2013-09-01 NOTE — Telephone Encounter (Signed)
Pt last CPE was 06/2012. Scheduled with Dr. Perrin Maltese for 11/29/13.

## 2013-11-10 ENCOUNTER — Other Ambulatory Visit: Payer: Self-pay | Admitting: Internal Medicine

## 2013-11-10 NOTE — Telephone Encounter (Signed)
Pt has appt sch for 11/29/13.

## 2013-11-18 ENCOUNTER — Other Ambulatory Visit: Payer: Self-pay | Admitting: Radiology

## 2013-11-18 NOTE — Telephone Encounter (Signed)
Ok to rf? 

## 2013-11-18 NOTE — Telephone Encounter (Signed)
Pt has appt sched for CPE on 11/29/13.

## 2013-11-29 ENCOUNTER — Encounter: Payer: BC Managed Care – PPO | Admitting: Internal Medicine

## 2013-12-01 ENCOUNTER — Ambulatory Visit (INDEPENDENT_AMBULATORY_CARE_PROVIDER_SITE_OTHER): Payer: Medicare Other | Admitting: Internal Medicine

## 2013-12-01 ENCOUNTER — Encounter: Payer: Self-pay | Admitting: Internal Medicine

## 2013-12-01 VITALS — BP 128/78 | HR 85 | Temp 97.9°F | Resp 16 | Ht 65.5 in | Wt 135.0 lb

## 2013-12-01 DIAGNOSIS — J9801 Acute bronchospasm: Secondary | ICD-10-CM

## 2013-12-01 DIAGNOSIS — Z125 Encounter for screening for malignant neoplasm of prostate: Secondary | ICD-10-CM

## 2013-12-01 DIAGNOSIS — R972 Elevated prostate specific antigen [PSA]: Secondary | ICD-10-CM

## 2013-12-01 DIAGNOSIS — R0602 Shortness of breath: Secondary | ICD-10-CM

## 2013-12-01 DIAGNOSIS — Z0000 Encounter for general adult medical examination without abnormal findings: Secondary | ICD-10-CM

## 2013-12-01 DIAGNOSIS — Z139 Encounter for screening, unspecified: Secondary | ICD-10-CM

## 2013-12-01 DIAGNOSIS — I10 Essential (primary) hypertension: Secondary | ICD-10-CM

## 2013-12-01 DIAGNOSIS — E039 Hypothyroidism, unspecified: Secondary | ICD-10-CM

## 2013-12-01 LAB — POCT URINALYSIS DIPSTICK
Bilirubin, UA: NEGATIVE
Glucose, UA: NEGATIVE
Ketones, UA: NEGATIVE
Leukocytes, UA: NEGATIVE
Nitrite, UA: NEGATIVE
Protein, UA: NEGATIVE
Spec Grav, UA: 1.02
pH, UA: 5.5

## 2013-12-01 LAB — CBC WITH DIFFERENTIAL/PLATELET
Basophils Absolute: 0.1 10*3/uL (ref 0.0–0.1)
Hemoglobin: 15.5 g/dL (ref 13.0–17.0)
Lymphocytes Relative: 21 % (ref 12–46)
Lymphs Abs: 2 10*3/uL (ref 0.7–4.0)
MCH: 29.9 pg (ref 26.0–34.0)
Monocytes Relative: 7 % (ref 3–12)
Neutro Abs: 5.2 10*3/uL (ref 1.7–7.7)
Neutrophils Relative %: 52 % (ref 43–77)
Platelets: 296 10*3/uL (ref 150–400)
RBC: 5.18 MIL/uL (ref 4.22–5.81)
WBC: 9.8 10*3/uL (ref 4.0–10.5)

## 2013-12-01 LAB — COMPREHENSIVE METABOLIC PANEL
Albumin: 4.2 g/dL (ref 3.5–5.2)
CO2: 23 mEq/L (ref 19–32)
Calcium: 8.9 mg/dL (ref 8.4–10.5)
Chloride: 103 mEq/L (ref 96–112)
Glucose, Bld: 80 mg/dL (ref 70–99)
Potassium: 4 mEq/L (ref 3.5–5.3)
Sodium: 139 mEq/L (ref 135–145)
Total Protein: 6.6 g/dL (ref 6.0–8.3)

## 2013-12-01 LAB — LIPID PANEL
Cholesterol: 215 mg/dL — ABNORMAL HIGH (ref 0–200)
VLDL: 22 mg/dL (ref 0–40)

## 2013-12-01 LAB — POCT UA - MICROSCOPIC ONLY: Yeast, UA: NEGATIVE

## 2013-12-01 LAB — TSH: TSH: 0.218 u[IU]/mL — ABNORMAL LOW (ref 0.350–4.500)

## 2013-12-01 MED ORDER — METHYLPREDNISOLONE ACETATE 80 MG/ML IJ SUSP
120.0000 mg | Freq: Once | INTRAMUSCULAR | Status: AC
  Administered 2013-12-01: 120 mg via INTRAMUSCULAR

## 2013-12-01 MED ORDER — ALBUTEROL SULFATE HFA 108 (90 BASE) MCG/ACT IN AERS: 2.0000 | INHALATION_SPRAY | Freq: Four times a day (QID) | RESPIRATORY_TRACT | Status: DC | PRN

## 2013-12-01 NOTE — Patient Instructions (Signed)
Allergies Allergies may happen from anything your body is sensitive to. This may be food, medicines, pollens, chemicals, and nearly anything around you in everyday life that produces allergens. An allergen is anything that causes an allergy producing substance. Heredity is often a factor in causing these problems. This means you may have some of the same allergies as your parents. Food allergies happen in all age groups. Food allergies are some of the most severe and life threatening. Some common food allergies are cow's milk, seafood, eggs, nuts, wheat, and soybeans. SYMPTOMS   Swelling around the mouth.  An itchy red rash or hives.  Vomiting or diarrhea.  Difficulty breathing. SEVERE ALLERGIC REACTIONS ARE LIFE-THREATENING. This reaction is called anaphylaxis. It can cause the mouth and throat to swell and cause difficulty with breathing and swallowing. In severe reactions only a trace amount of food (for example, peanut oil in a salad) may cause death within seconds. Seasonal allergies occur in all age groups. These are seasonal because they usually occur during the same season every year. They may be a reaction to molds, grass pollens, or tree pollens. Other causes of problems are house dust mite allergens, pet dander, and mold spores. The symptoms often consist of nasal congestion, a runny itchy nose associated with sneezing, and tearing itchy eyes. There is often an associated itching of the mouth and ears. The problems happen when you come in contact with pollens and other allergens. Allergens are the particles in the air that the body reacts to with an allergic reaction. This causes you to release allergic antibodies. Through a chain of events, these eventually cause you to release histamine into the blood stream. Although it is meant to be protective to the body, it is this release that causes your discomfort. This is why you were given anti-histamines to feel better. If you are unable to  pinpoint the offending allergen, it may be determined by skin or blood testing. Allergies cannot be cured but can be controlled with medicine. Hay fever is a collection of all or some of the seasonal allergy problems. It may often be treated with simple over-the-counter medicine such as diphenhydramine. Take medicine as directed. Do not drink alcohol or drive while taking this medicine. Check with your caregiver or package insert for child dosages. If these medicines are not effective, there are many new medicines your caregiver can prescribe. Stronger medicine such as nasal spray, eye drops, and corticosteroids may be used if the first things you try do not work well. Other treatments such as immunotherapy or desensitizing injections can be used if all else fails. Follow up with your caregiver if problems continue. These seasonal allergies are usually not life threatening. They are generally more of a nuisance that can often be handled using medicine. HOME CARE INSTRUCTIONS   If unsure what causes a reaction, keep a diary of foods eaten and symptoms that follow. Avoid foods that cause reactions.  If hives or rash are present:  Take medicine as directed.  You may use an over-the-counter antihistamine (diphenhydramine) for hives and itching as needed.  Apply cold compresses (cloths) to the skin or take baths in cool water. Avoid hot baths or showers. Heat will make a rash and itching worse.  If you are severely allergic:  Following a treatment for a severe reaction, hospitalization is often required for closer follow-up.  Wear a medic-alert bracelet or necklace stating the allergy.  You and your family must learn how to give adrenaline or use   an anaphylaxis kit.  If you have had a severe reaction, always carry your anaphylaxis kit or EpiPen with you. Use this medicine as directed by your caregiver if a severe reaction is occurring. Failure to do so could have a fatal outcome. SEEK MEDICAL  CARE IF:  You suspect a food allergy. Symptoms generally happen within 30 minutes of eating a food.  Your symptoms have not gone away within 2 days or are getting worse.  You develop new symptoms.  You want to retest yourself or your child with a food or drink you think causes an allergic reaction. Never do this if an anaphylactic reaction to that food or drink has happened before. Only do this under the care of a caregiver. SEEK IMMEDIATE MEDICAL CARE IF:   You have difficulty breathing, are wheezing, or have a tight feeling in your chest or throat.  You have a swollen mouth, or you have hives, swelling, or itching all over your body.  You have had a severe reaction that has responded to your anaphylaxis kit or an EpiPen. These reactions may return when the medicine has worn off. These reactions should be considered life threatening. MAKE SURE YOU:   Understand these instructions.  Will watch your condition.  Will get help right away if you are not doing well or get worse. Document Released: 03/11/2003 Document Revised: 04/12/2013 Document Reviewed: 08/15/2008 [facility] Patient Information 2014 [place], [place]. Bronchospasm, Adult A bronchospasm is a spasm or tightening of the airways going into the lungs. During a bronchospasm breathing becomes more difficult because the airways get smaller. When this happens there can be coughing, a whistling sound when breathing (wheezing), and difficulty breathing. Bronchospasm is often associated with asthma, but not all patients who experience a bronchospasm have asthma. CAUSES  A bronchospasm is caused by inflammation or irritation of the airways. The inflammation or irritation may be triggered by:   Allergies (such as to animals, pollen, food, or mold). Allergens that cause bronchospasm may cause wheezing immediately after exposure or many hours later.   Infection. Viral infections are believed to be the most common cause of bronchospasm.    Exercise.   Irritants (such as pollution, cigarette smoke, strong odors, aerosol sprays, and paint fumes).   Weather changes. Winds increase molds and pollens in the air. Rain refreshes the air by washing irritants out. Cold air may cause inflammation.   Stress and emotional upset.  SIGNS AND SYMPTOMS   Wheezing.   Excessive nighttime coughing.   Frequent or severe coughing with a simple cold.   Chest tightness.   Shortness of breath.  DIAGNOSIS  Bronchospasm is usually diagnosed through a history and physical exam. Tests, such as chest X-rays, are sometimes done to look for other conditions. TREATMENT   Inhaled medicines can be given to open up your airways and help you breathe. The medicines can be given using either an inhaler or a nebulizer machine.  Corticosteroid medicines may be given for severe bronchospasm, usually when it is associated with asthma. HOME CARE INSTRUCTIONS   Always have a plan prepared for seeking medical care. Know when to call your health care provider and local emergency services (911 in the [place] Know where you can access local emergency care.  Only take medicines as directed by your health care provider.  If you were prescribed an inhaler or nebulizer machine, ask your health care provider to explain how to use it correctly. Always use a spacer with your inhaler if you were given  one.  It is necessary to remain calm during an attack. Try to relax and breathe more slowly.  Control your home environment in the following ways:   Change your heating and air conditioning filter at least once a month.   Limit your use of fireplaces and wood stoves.  Do not smoke and do not allow smoking in your home.   Avoid exposure to perfumes and fragrances.   Get rid of pests (such as roaches and mice) and their droppings.   Throw away plants if you see mold on them.   Keep your house clean and dust free.   Replace carpet with wood,  tile, or vinyl flooring. Carpet can trap dander and dust.   Use allergy-proof pillows, mattress covers, and box spring covers.   Wash bed sheets and blankets every week in hot water and dry them in a dryer.   Use blankets that are made of polyester or cotton.   Wash hands frequently. SEEK MEDICAL CARE IF:   You have muscle aches.   You have chest pain.   The sputum changes from clear or white to yellow, green, gray, or bloody.   The sputum you cough up gets thicker.   There are problems that may be related to the medicine you are given, such as a rash, itching, swelling, or trouble breathing.  SEEK IMMEDIATE MEDICAL CARE IF:   You have worsening wheezing and coughing even after taking your prescribed medicines.   You have increased difficulty breathing.   You develop severe chest pain. MAKE SURE YOU:   Understand these instructions.  Will watch your condition.  Will get help right away if you are not doing well or get worse. Document Released: 12/19/2003 Document Revised: 08/18/2013 Document Reviewed: 06/07/2013 [facility] Patient Information 2014 [place], [place].

## 2013-12-01 NOTE — Progress Notes (Signed)
Subjective:    Patient ID: Shawn Santos, male    DOB: 1946/06/17, 67 y.o.   MRN: [id number]  HPI Shawn Santos is here for cpe, his only new complaint is sob while trying to sleep. He has new feather pillows and a hx of asthma in the past. No cp, ankle swelling or heart disease. HTN controlled, Elevated psa followed by urology, hypothyroid txed, He has coeliac and doing well working with GI Dr. Arlyce Dice, new bloating lately.   Review of Systems  Constitutional: Negative.   HENT: Negative.   Eyes: Negative.   Respiratory: Positive for choking and shortness of breath.   Cardiovascular: Negative.   Gastrointestinal: Positive for abdominal distention.  Endocrine: Negative.   Genitourinary: Negative.   Musculoskeletal: Negative.   Skin: Negative.   Allergic/Immunologic: Positive for environmental allergies and food allergies.  Neurological: Negative.   Hematological: Negative.   Psychiatric/Behavioral: Negative.        Objective:   Physical Exam  Constitutional: He is oriented to person, place, and time. He appears well-developed. No distress.  HENT:  Head: Normocephalic.  Right Ear: External ear normal.  Left Ear: External ear normal.  Nose: Nose normal.  Mouth/Throat: Oropharynx is clear and moist.  Eyes: Conjunctivae and EOM are normal. Pupils are equal, round, and reactive to light.  Neck: Normal range of motion. Neck supple. No JVD present. No tracheal deviation present. No thyromegaly present.  Cardiovascular: Normal rate, regular rhythm, normal heart sounds and intact distal pulses.   No murmur heard. Pulmonary/Chest: Effort normal. No accessory muscle usage. Not tachypneic. He has decreased breath sounds. He has wheezes. He has no rhonchi. He has rales.  Abdominal: Soft. He exhibits no mass. There is tenderness.  Genitourinary: Rectum normal, prostate normal and penis normal.  Musculoskeletal: Normal range of motion.  Lymphadenopathy:    He has no cervical adenopathy.   Neurological: He is alert and oriented to person, place, and time. He has normal reflexes. No cranial nerve deficit or sensory deficit. He exhibits normal muscle tone. Coordination and gait normal. He displays no Babinski's sign on the right side. He displays no Babinski's sign on the left side.  Skin: No rash noted.  Psychiatric: He has a normal mood and affect. His behavior is normal. Judgment and thought content normal.   PFR 400  Normal 500 L/min Neb tx Post PFR 440 and feels much better Moves air better Results for orders placed in visit on 02/01/13  COMPREHENSIVE METABOLIC PANEL      Result Value Range   Sodium 140  135 - 145 mEq/L   Potassium 5.2  3.5 - 5.3 mEq/L   Chloride 105  96 - 112 mEq/L   CO2 22  19 - 32 mEq/L   Glucose, Bld 93  70 - 99 mg/dL   BUN 20  6 - 23 mg/dL   Creat 4.69  6.29 - 5.28 mg/dL   Total Bilirubin 0.7  0.3 - 1.2 mg/dL   Alkaline Phosphatase 68  39 - 117 U/L   AST 24  0 - 37 U/L   ALT 18  0 - 53 U/L   Total Protein 7.3  6.0 - 8.3 g/dL   Albumin 4.8  3.5 - 5.2 g/dL   Calcium 9.8  8.4 - 41.3 mg/dL  EOSINOPHIL COUNT      Result Value Range   Eosinophils Absolute 1.3 (*) 0.0 - 0.7 K/uL  POCT UA - MICROSCOPIC ONLY      Result Value Range  WBC, Ur, HPF, POC 0-1     RBC, urine, microscopic neg     Bacteria, U Microscopic neg     Mucus, UA trace     Epithelial cells, urine per micros 0-2     Crystals, Ur, HPF, POC neg     Casts, Ur, LPF, POC neg     Yeast, UA neg    POCT URINALYSIS DIPSTICK      Result Value Range   Color, UA yellow     Clarity, UA clear     Glucose, UA neg     Bilirubin, UA neg     Ketones, UA neg     Spec Grav, UA 1.015     Blood, UA neg     pH, UA 5.5     Protein, UA neg     Urobilinogen, UA 0.2     Nitrite, UA neg     Leukocytes, UA Negative     ekg unchanged  New bronchospasm probably caused by feather pillows    Assessment & Plan:  See Dr. Arlyce Dice for persistent bloating Continue with urology Shingles vac order  given RF meds 1 year/Add albuterol inhaler/Depomedrol 120mg  im Repeat PFR 6 months

## 2013-12-01 NOTE — Progress Notes (Signed)
   Subjective:    Patient ID: Shawn Santos, male    DOB: 1946-11-22, 67 y.o.   MRN: [id number]  HPI    Review of Systems  Constitutional: Negative.   HENT: Negative.   Eyes: Negative.   Respiratory: Positive for chest tightness.   Cardiovascular: Negative.   Gastrointestinal: Positive for abdominal distention.  Endocrine: Negative.   Genitourinary: Negative.   Musculoskeletal: Negative.   Skin: Negative.   Allergic/Immunologic: Negative.   Neurological: Negative.   Hematological: Negative.   Psychiatric/Behavioral: Negative.        Objective:   Physical Exam        Assessment & Plan:

## 2013-12-06 ENCOUNTER — Encounter: Payer: Self-pay | Admitting: Radiology

## 2013-12-09 ENCOUNTER — Encounter: Payer: Self-pay | Admitting: Gastroenterology

## 2013-12-09 ENCOUNTER — Ambulatory Visit (INDEPENDENT_AMBULATORY_CARE_PROVIDER_SITE_OTHER): Payer: Medicare Other | Admitting: Internal Medicine

## 2013-12-09 VITALS — BP 152/98 | HR 86 | Temp 97.5°F | Resp 16 | Ht 65.25 in | Wt 136.2 lb

## 2013-12-09 DIAGNOSIS — E782 Mixed hyperlipidemia: Secondary | ICD-10-CM

## 2013-12-09 DIAGNOSIS — E039 Hypothyroidism, unspecified: Secondary | ICD-10-CM

## 2013-12-09 DIAGNOSIS — K921 Melena: Secondary | ICD-10-CM

## 2013-12-09 LAB — T3, FREE: T3, Free: 2.6 pg/mL (ref 2.3–4.2)

## 2013-12-09 LAB — T4, FREE: Free T4: 1.37 ng/dL (ref 0.80–1.80)

## 2013-12-09 MED ORDER — ATORVASTATIN CALCIUM 10 MG PO TABS: 10.0000 mg | ORAL_TABLET | Freq: Every day | ORAL | Status: DC

## 2013-12-09 NOTE — Progress Notes (Signed)
   Subjective:    Patient ID: Shawn Santos, male    DOB: [birth year], 67 y.o.   MRN: [id number]  HPI Patient states that he's here to follow up with his thyroid and cholesterol. He's claim that he is doing well at home and feels wonderful. His BP is much higher than last visit at 152/98. No aches or pains. No need for medicine right at this moment His pressure is normal at home. He also has hemosure positive stools.   Review of Systems     Objective:   Physical Exam  Vitals reviewed. Constitutional: He is oriented to person, place, and time. He appears well-developed and well-nourished. No distress.  HENT:  Head: Normocephalic.  Eyes: EOM are normal.  Cardiovascular: Normal rate.   Pulmonary/Chest: Effort normal.  Musculoskeletal: Normal range of motion.  Neurological: He is alert and oriented to person, place, and time.  Psychiatric: He has a normal mood and affect.          Assessment & Plan:  Blood in stool-see Dr. Arlyce Dice High LDL start Lipitor 10mg  qhs Low TSH do free T3,T4. RTC and see Dr Perrin Maltese 3-4 months check lipids.

## 2013-12-09 NOTE — Patient Instructions (Addendum)
Cholesterol Cholesterol is a white, waxy, fat-like protein needed by your body in small amounts. The liver makes all the cholesterol you need. It is carried from the liver by the blood through the blood vessels. Deposits (plaque) may build up on blood vessel walls. This makes the arteries narrower and stiffer. Plaque increases the risk for heart attack and stroke. You cannot feel your cholesterol level even if it is very high. The only way to know is by a blood test to check your lipid (fats) levels. Once you know your cholesterol levels, you should keep a record of the test results. Work with your caregiver to to keep your levels in the desired range. WHAT THE RESULTS MEAN:  Total cholesterol is a rough measure of all the cholesterol in your blood.  LDL is the so-called bad cholesterol. This is the type that deposits cholesterol in the walls of the arteries. You want this level to be low.  HDL is the good cholesterol because it cleans the arteries and carries the LDL away. You want this level to be high.  Triglycerides are fat that the body can either burn for energy or store. High levels are closely linked to heart disease. DESIRED LEVELS:  Total cholesterol below 200.  LDL below 100 for people at risk, below 70 for very high risk.  HDL above 50 is good, above 60 is best.  Triglycerides below 150. HOW TO LOWER YOUR CHOLESTEROL:  Diet.  Choose fish or white meat chicken and [place], roasted or baked. Limit fatty cuts of red meat, fried foods, and processed meats, such as sausage and lunch meat.  Eat lots of fresh fruits and vegetables. Choose whole grains, beans, pasta, potatoes and cereals.  Use only small amounts of olive, corn or canola oils. Avoid butter, mayonnaise, shortening or palm kernel oils. Avoid foods with trans-fats.  Use skim/nonfat milk and low-fat/nonfat yogurt and cheeses. Avoid whole milk, cream, ice cream, egg yolks and cheeses. Healthy desserts include angel food  cake, ginger snaps, animal crackers, hard candy, popsicles, and low-fat/nonfat frozen yogurt. Avoid pastries, cakes, pies and cookies.  Exercise.  A regular program helps decrease LDL and raises HDL.  Helps with weight control.  Do things that increase your activity level like gardening, walking, or taking the stairs.  Medication.  May be prescribed by your caregiver to help lowering cholesterol and the risk for heart disease.  You may need medicine even if your levels are normal if you have several risk factors. HOME CARE INSTRUCTIONS   Follow your diet and exercise programs as suggested by your caregiver.  Take medications as directed.  Have blood work done when your caregiver feels it is necessary. MAKE SURE YOU:   Understand these instructions.  Will watch your condition.  Will get help right away if you are not doing well or get worse. Document Released: 09/10/2001 Document Revised: 03/09/2012 Document Reviewed: 03/02/2008 [facility] Patient Information 2014 [place], [place]. Bloody Stools Bloody stools often mean that there is a problem in the digestive tract. Your caregiver may use the term "melena" to describe black, tarry, and bad smelling stools or "hematochezia" to describe red or maroon-colored stools. Blood seen in the stool can be caused by bleeding anywhere along the intestinal tract.  A black stool usually means that blood is coming from the upper part of the gastrointestinal tract (esophagus, stomach, or small bowel). Passing maroon-colored stools or bright red blood usually means that blood is coming from lower down in the large bowel  or the rectum. However, sometimes massive bleeding in the stomach or small intestine can cause bright red bloody stools.  Consuming black licorice, lead, iron pills, medicines containing bismuth subsalicylate, or blueberries can also cause black stools. Your caregiver can test black stools to see if blood is present. It is important  that the cause of the bleeding be found. Treatment can then be started, and the problem can be corrected. Rectal bleeding may not be serious, but you should not assume everything is okay until you know the cause.It is very important to follow up with your caregiver or a specialist in gastrointestinal problems. CAUSES  Blood in the stools can come from various underlying causes.Often, the cause is not found during your first visit. Testing is often needed to discover the cause of bleeding in the gastrointestinal tract. Causes range from simple to serious or even life-threatening.Possible causes include:  Hemorrhoids.These are veins that are full of blood (engorged) in the rectum. They cause pain, inflammation, and may bleed.  Anal fissures.These are areas of painful tearing which may bleed. They are often caused by passing hard stool.  Diverticulosis.These are pouches that form on the colon over time, with age, and may bleed significantly.  Diverticulitis.This is inflammation in areas with diverticulosis. It can cause pain, fever, and bloody stools, although bleeding is rare.  Proctitis and colitis. These are inflamed areas of the rectum or colon. They may cause pain, fever, and bloody stools.  Polyps and cancer. Colon cancer is a leading cause of preventable cancer death.It often starts out as precancerous polyps that can be removed during a colonoscopy, preventing progression into cancer. Sometimes, polyps and cancer may cause rectal bleeding.  Gastritis and ulcers.Bleeding from the upper gastrointestinal tract (near the stomach) may travel through the intestines and produce black, sometimes tarry, often bad smelling stools. In certain cases, if the bleeding is fast enough, the stools may not be black, but red and the condition may be life-threatening. SYMPTOMS  You may have stools that are bright red and bloody, that are normal color with blood on them, or that are dark black and tarry.  In some cases, you may only have blood in the toilet bowl. Any of these cases need medical care. You may also have:  Pain at the anus or anywhere in the rectum.  Lightheadedness or feeling faint.  Extreme weakness.  Nausea or vomiting.  Fever. DIAGNOSIS Your caregiver may use the following methods to find the cause of your bleeding:  Taking a medical history. Age is important. Older people tend to develop polyps and cancer more often. If there is anal pain and a hard, large stool associated with bleeding, a tear of the anus may be the cause. If blood drips into the toilet after a bowel movement, bleeding hemorrhoids may be the problem. The color and frequency of the bleeding are additional considerations. In most cases, the medical history provides clues, but seldom the final answer.  A visual and finger (digital) exam. Your caregiver will inspect the anal area, looking for tears and hemorrhoids. A finger exam can provide information when there is tenderness or a growth inside. In men, the prostate is also examined.  Endoscopy. Several types of small, long scopes (endoscopes) are used to view the colon.  In the office, your caregiver may use a rigid, or more commonly, a flexible viewing sigmoidoscope. This exam is called flexible sigmoidoscopy. It is performed in 5 to 10 minutes.  A more thorough exam is accomplished with  a colonoscope. It allows your caregiver to view the entire 5 to 6 foot long colon. Medicine to help you relax (sedative) is usually given for this exam. Frequently, a bleeding lesion may be present beyond the reach of the sigmoidoscope. So, a colonoscopy may be the best exam to start with. Both exams are usually done on an outpatient basis. This means the patient does not stay overnight in the hospital or surgery center.  An upper endoscopy may be needed to examine your stomach. Sedation is used and a flexible endoscope is put in your mouth, down to your stomach.  A barium  enema X-ray. This is an X-ray exam. It uses liquid barium inserted by enema into the rectum. This test alone may not identify an actual bleeding point. X-rays highlight abnormal shadows, such as those made by lumps (tumors), diverticuli, or colitis. TREATMENT  Treatment depends on the cause of your bleeding.   For bleeding from the stomach or colon, the caregiver doing your endoscopy or colonoscopy may be able to stop the bleeding as part of the procedure.  Inflammation or infection of the colon can be treated with medicines.  Many rectal problems can be treated with creams, suppositories, or warm baths.  Surgery is sometimes needed.  Blood transfusions are sometimes needed if you have lost a lot of blood.  For any bleeding problem, let your caregiver know if you take aspirin or other blood thinners regularly. HOME CARE INSTRUCTIONS   Take any medicines exactly as prescribed.  Keep your stools soft by eating a diet high in fiber. Prunes (1 to 3 a day) work well for many people.  Drink enough water and fluids to keep your urine clear or pale yellow.  Take sitz baths if advised. A sitz bath is when you sit in a bathtub with warm water for 10 to 15 minutes to soak, soothe, and cleanse the rectal area.  If enemas or suppositories are advised, be sure you know how to use them. Tell your caregiver if you have problems with this.  Monitor your bowel movements to look for signs of improvement or worsening. SEEK MEDICAL CARE IF:   You do not improve in the time expected.  Your condition worsens after initial improvement.  You develop any new symptoms. SEEK IMMEDIATE MEDICAL CARE IF:   You develop severe or prolonged rectal bleeding.  You vomit blood.  You feel weak or faint.  You have a fever. MAKE SURE YOU:  Understand these instructions.  Will watch your condition.  Will get help right away if you are not doing well or get worse. Document Released: 12/06/2002 Document  Revised: 03/09/2012 Document Reviewed: 05/03/2011 [facility] Patient Information 2014 [place], [place].

## 2013-12-16 ENCOUNTER — Telehealth: Payer: Self-pay

## 2013-12-16 NOTE — Telephone Encounter (Signed)
Patient got letter for his labs in the mail but would like some clarification [phone]

## 2013-12-16 NOTE — Telephone Encounter (Signed)
Called, labs normal. Patient advised.

## 2014-01-17 ENCOUNTER — Ambulatory Visit: Payer: BC Managed Care – PPO | Admitting: Gastroenterology

## 2014-01-17 ENCOUNTER — Other Ambulatory Visit: Payer: Self-pay | Admitting: Internal Medicine

## 2014-01-27 ENCOUNTER — Encounter: Payer: Self-pay | Admitting: Gastroenterology

## 2014-02-12 ENCOUNTER — Other Ambulatory Visit: Payer: Self-pay | Admitting: Internal Medicine

## 2014-04-16 ENCOUNTER — Ambulatory Visit (HOSPITAL_COMMUNITY)
Admission: RE | Admit: 2014-04-16 | Discharge: 2014-04-16 | Disposition: A | Discharge status: Home / Self Care | Payer: Medicare Other | Source: Ambulatory Visit | Attending: Family Medicine | Admitting: Family Medicine

## 2014-04-16 ENCOUNTER — Ambulatory Visit: Payer: Medicare Other

## 2014-04-16 ENCOUNTER — Encounter (HOSPITAL_COMMUNITY): Payer: Self-pay

## 2014-04-16 ENCOUNTER — Ambulatory Visit (INDEPENDENT_AMBULATORY_CARE_PROVIDER_SITE_OTHER): Payer: Medicare Other | Admitting: Family Medicine

## 2014-04-16 VITALS — BP 124/74 | HR 101 | Temp 97.5°F | Resp 16 | Ht 65.0 in | Wt 141.2 lb

## 2014-04-16 DIAGNOSIS — R143 Flatulence: Secondary | ICD-10-CM

## 2014-04-16 DIAGNOSIS — R142 Eructation: Secondary | ICD-10-CM

## 2014-04-16 DIAGNOSIS — R140 Abdominal distension (gaseous): Secondary | ICD-10-CM

## 2014-04-16 DIAGNOSIS — R05 Cough: Secondary | ICD-10-CM | POA: Insufficient documentation

## 2014-04-16 DIAGNOSIS — J984 Other disorders of lung: Secondary | ICD-10-CM | POA: Insufficient documentation

## 2014-04-16 DIAGNOSIS — R000 Tachycardia, unspecified: Secondary | ICD-10-CM | POA: Insufficient documentation

## 2014-04-16 DIAGNOSIS — E039 Hypothyroidism, unspecified: Secondary | ICD-10-CM

## 2014-04-16 DIAGNOSIS — J309 Allergic rhinitis, unspecified: Secondary | ICD-10-CM

## 2014-04-16 DIAGNOSIS — R141 Gas pain: Secondary | ICD-10-CM

## 2014-04-16 DIAGNOSIS — R059 Cough, unspecified: Secondary | ICD-10-CM | POA: Insufficient documentation

## 2014-04-16 DIAGNOSIS — T7840XA Allergy, unspecified, initial encounter: Secondary | ICD-10-CM | POA: Insufficient documentation

## 2014-04-16 DIAGNOSIS — X58XXXA Exposure to other specified factors, initial encounter: Secondary | ICD-10-CM | POA: Insufficient documentation

## 2014-04-16 LAB — POCT CBC
Granulocyte percent: 74.9 %G (ref 37–80)
HCT, POC: 41.7 % — AB (ref 43.5–53.7)
Hemoglobin: 13.6 g/dL — AB (ref 14.1–18.1)
LYMPH, POC: 1.4 (ref 0.6–3.4)
MCH, POC: 30.8 pg (ref 27–31.2)
MCHC: 32.6 g/dL (ref 31.8–35.4)
MCV: 94.5 fL (ref 80–97)
MID (CBC): 1.2 — AB (ref 0–0.9)
MPV: 8.2 fL (ref 0–99.8)
PLATELET COUNT, POC: 278 10*3/uL (ref 142–424)
POC GRANULOCYTE: 7.8 — AB (ref 2–6.9)
POC LYMPH PERCENT: 13.7 %L (ref 10–50)
POC MID %: 11.4 % (ref 0–12)
RBC: 4.41 M/uL — AB (ref 4.69–6.13)
RDW, POC: 13.4 %
WBC: 10.4 10*3/uL — AB (ref 4.6–10.2)

## 2014-04-16 LAB — LIPASE: Lipase: <10 U/L (ref 0–75)

## 2014-04-16 LAB — COMPREHENSIVE METABOLIC PANEL
ALK PHOS: 89 U/L (ref 39–117)
ALT: 41 U/L (ref 0–53)
AST: 37 U/L (ref 0–37)
Albumin: 3.1 g/dL — ABNORMAL LOW (ref 3.5–5.2)
BILIRUBIN TOTAL: 0.4 mg/dL (ref 0.2–1.2)
BUN: 30 mg/dL — ABNORMAL HIGH (ref 6–23)
CO2: 22 mEq/L (ref 19–32)
CREATININE: 1.42 mg/dL — AB (ref 0.50–1.35)
Calcium: 8.2 mg/dL — ABNORMAL LOW (ref 8.4–10.5)
Chloride: 97 mEq/L (ref 96–112)
Glucose, Bld: 92 mg/dL (ref 70–99)
Potassium: 4.3 mEq/L (ref 3.5–5.3)
SODIUM: 132 meq/L — AB (ref 135–145)
TOTAL PROTEIN: 5.6 g/dL — AB (ref 6.0–8.3)

## 2014-04-16 LAB — CREATININE, SERUM
Creatinine, Ser: 1.49 mg/dL — ABNORMAL HIGH (ref 0.50–1.35)
GFR calc Af Amer: 53 mL/min — ABNORMAL LOW (ref >90)
GFR calc non Af Amer: 46 mL/min — ABNORMAL LOW (ref >90)

## 2014-04-16 LAB — D-DIMER, QUANTITATIVE (NOT AT ARMC): D DIMER QUANT: 0.61 ug{FEU}/mL — AB (ref 0.00–0.48)

## 2014-04-16 LAB — BUN: BUN: 30 mg/dL — ABNORMAL HIGH (ref 6–23)

## 2014-04-16 LAB — TSH: TSH: 1.637 u[IU]/mL (ref 0.350–4.500)

## 2014-04-16 LAB — AMYLASE: Amylase: 52 U/L (ref 0–105)

## 2014-04-16 MED ORDER — IOHEXOL 350 MG/ML SOLN
80.0000 mL | Freq: Once | INTRAVENOUS | Status: AC | PRN
  Administered 2014-04-16: 50 mL via INTRAVENOUS

## 2014-04-16 NOTE — Patient Instructions (Addendum)
Please try some OTC simethicone (gas-x is one brand) for your abdominal bloating.  It appears that your bloating is due to gas, but I will be in touch with the rest of your labs.  If you start to have any pain or other concerning symptoms let me know right away!   Use the flonase nasal spray for your nasal symptoms.  For your eyes I would recommend that you use genteal eye gel for dry eyes.  Continue to use allegra as needed, or you might try claritin or zyrtec  I will give you a call with your D dimer test later today.  If it is positive we will consider a CT scan of your lungs.    As soon as I get your other labs in I will either call you or send you results on mychart

## 2014-04-16 NOTE — Progress Notes (Signed)
[facility] [address], [place] [address] [phone]  Date:  04/16/2014   Name:  Shawn Santos   DOB:  25-Oct-1946   MRN:  [id number]  PCP:  Kennon Portela, MD    Chief Complaint: Nasal Congestion, Sore Throat and Weight Gain   History of Present Illness:  Shawn Santos is a 68 y.o. very pleasant male patient who presents with the following:  Here today with possible illness.    I will actually be seeing him for his annual exam in June.   He notes sore throat, congestion in his sinuses and face.  He has noted puffiness around his eyes for the last couple of days He has sneezing and runny nose, and itchy eyes.   He has also noted abdominal bloating for about one week.  He does not have any pain but notes that his belly is larger than usual.  He has been having stools as usual He has celiac- he has been following his diet well. He sees Dr. Deatra Ina- last colonoscopy 7 or 8 years ago and looked ok  He has not noted fever or chills, no aches.  He has an occasional cough.    He has tried Human resources officer, benadryl, afrin OTC.    He is taking his thyroid replacement medication He did take predinsone in December and did well with this   Patient Active Problem List   Diagnosis Date Noted  . Eosinophilia 02/01/2013  . Elevated PSA 02/01/2013  . HTN (hypertension) 07/27/2012  . Insomnia 07/27/2012  . Hypothyroid 07/27/2012  . ANEMIA, IRON DEFICIENCY 04/18/2008  . CELIAC SPRUE 04/18/2008  . DUODENITIS, WITHOUT HEMORRHAGE 06/02/2007    Past Medical History  Diagnosis Date  . Hypertension   . Thyroid disease   . Insomnia   . PSA elevation   . Celiac disease     Past Surgical History  Procedure Laterality Date  . Eye surgery      History  Substance Use Topics  . Smoking status: Never Smoker   . Smokeless tobacco: Never Used  . Alcohol Use: No     Comment: RARELY BEER    Family History  Problem Relation Age of Onset  . Alcohol abuse  Mother   . COPD Father   . Diabetes Son     No Known Allergies  Medication list has been reviewed and updated.  Current Outpatient Prescriptions on File Prior to Visit  Medication Sig Dispense Refill  . albuterol (PROVENTIL HFA;VENTOLIN HFA) 108 (90 BASE) MCG/ACT inhaler Inhale 2 puffs into the lungs every 6 (six) hours as needed for wheezing or shortness of breath.  1 Inhaler  6  . aspirin 81 MG tablet Take 81 mg by mouth daily.      Marland Kitchen atorvastatin (LIPITOR) 10 MG tablet Take 1 tablet (10 mg total) by mouth daily.  90 tablet  3  . Cholecalciferol (D3 ADULT PO) Take 2,000 Int'l Units by mouth daily.      Marland Kitchen levothyroxine (SYNTHROID, LEVOTHROID) 100 MCG tablet TAKE 1 TABLET EVERY DAY  30 tablet  3  . lisinopril (PRINIVIL,ZESTRIL) 5 MG tablet TAKE 1 TABLET BY MOUTH EVERY DAY FOR HIGH BLOOD PRESSURE  90 tablet  1  . Multiple Vitamin (MULTI VITAMIN DAILY PO) Take by mouth.      . zolpidem (AMBIEN) 10 MG tablet TAKE ONE TABLET BY MOUTH AT BEDTIME AS NEEDED FOR SLEEP  90 tablet  1  . ALPRAZolam (XANAX) 1 MG  tablet 1/2-1 hs prn  30 tablet  0  . HYDROcodone-acetaminophen (VICODIN) 5-500 MG per tablet Take 1 tablet by mouth every 8 (eight) hours as needed for pain.  30 tablet  0  . levothyroxine (SYNTHROID, LEVOTHROID) 75 MCG tablet TAKE 1 TABLET EVERY DAY  90 tablet  2  . methocarbamol (ROBAXIN-750) 750 MG tablet Take 1 tablet (750 mg total) by mouth 4 (four) times daily.  40 tablet  1  . predniSONE (DELTASONE) 10 MG tablet Take po pc 6-5-4-3-2-1 over 6 days for nerve pain in neck  21 tablet  0   No current facility-administered medications on file prior to visit.    Review of Systems:  As per HPI- otherwise negative.   Physical Examination: Filed Vitals:   04/16/14 0904  BP: 124/74  Pulse: 101  Temp: 97.5 F (36.4 C)  Resp: 16   Filed Vitals:   04/16/14 0904  Height: 5\' 5"  (1.651 m)  Weight: 141 lb 3.2 oz (64.048 kg)   Body mass index is 23.5 kg/(m^2). Ideal Body Weight:  Weight in (lb) to have BMI = 25: 149.9  GEN: WDWN, NAD, Non-toxic, A & O x 3, looks well HEENT: Atraumatic, Normocephalic. Neck supple. No masses, No LAD.  Bilateral cerumen impaction, oropharynx normal.  PEERL,EOMI.   Ears and Nose: No external deformity. CV: No M/G/R. No JVD. No thrill. No extra heart sounds.  Noted to have tachycardia and some irregular beats- possible PVCs PULM: CTA B, no wheezes, crackles, rhonchi. No retractions. No resp. distress. No accessory muscle use. ABD: S, NT, ND, +BS. No rebound. No HSM.  Belly does seem bloated, but is soft and non- tender EXTR: No c/c/e NEURO Normal gait.  PSYCH: Normally interactive. Conversant. Not depressed or anxious appearing.  Calm demeanor.   [facility] reading (PRIMARY) by  Dr. Lorelei Pont. abd sereis: no evidence of SBO- no air fluid levels.  He does have a lot of gas in the colon  ABDOMEN - 2 VIEW  COMPARISON: Acute abdominal series 11/19/2012.  FINDINGS: Gas and stool are seen scattered throughout the colon extending to the level of the distal rectum. Several nondilated loops of gas-filled small bowel are noted in the upper central abdomen and (nonspecific). No pathologic distension of small bowel is noted. No gross evidence of pneumoperitoneum.  IMPRESSION: 1. Nonspecific, nonobstructive bowel gas pattern. 2. No pneumoperitoneum.  CXR: negative  CHEST 2 VIEW  COMPARISON: 11/19/2012.  FINDINGS: The heart size and mediastinal contours are within normal limits. Both lungs are clear. The visualized skeletal structures are unremarkable.  IMPRESSION: No active cardiopulmonary disease. Stable appearance from priors.  EKG:   Tachycardic between 104 and 116 BPM, reduced voltage  Results for orders placed in visit on 04/16/14  POCT CBC      Result Value Ref Range   WBC 10.4 (*) 4.6 - 10.2 K/uL   Lymph, poc 1.4  0.6 - 3.4   POC LYMPH PERCENT 13.7  10 - 50 %L   MID (cbc) 1.2 (*) 0 - 0.9   POC MID % 11.4  0 - 12 %M   POC  Granulocyte 7.8 (*) 2 - 6.9   Granulocyte percent 74.9  37 - 80 %G   RBC 4.41 (*) 4.69 - 6.13 M/uL   Hemoglobin 13.6 (*) 14.1 - 18.1 g/dL   HCT, POC 41.7 (*) 43.5 - 53.7 %   MCV 94.5  80 - 97 fL   MCH, POC 30.8  27 - 31.2 pg   MCHC  32.6  31.8 - 35.4 g/dL   RDW, POC 13.4     Platelet Count, POC 278  142 - 424 K/uL   MPV 8.2  0 - 99.8 fL   Received D dimer- unfortunately it is positive.  Called and discussed with Shawn Santos- we will send for a CT angiogram  Assessment and Plan: Abdominal bloating - Plan: POCT CBC, Comprehensive metabolic panel, DG Abd 2 Views, TSH, Amylase, Lipase, BUN, Creatine  Unspecified hypothyroidism - Plan: TSH  Allergic rhinitis  Tachycardia, unspecified - Plan: EKG 12-Lead, DG Chest 2 View, D-dimer, quantitative, CT Angio Chest PE W/Cm &/Or Wo Cm, BUN, Creatine  Shawn Santos is here today with likely allergic rhinitis and abdominal fullness likely due to excess gas.  However he is noted to have persistent tachycardia and an EKG which suggests possible pulmonary discease.  After discussed did a d dimer- when positive this triggerred a CT angiogram.  fortunatley his CT is negative for a PE   CT ANGIOGRAPHY CHEST WITH CONTRAST  TECHNIQUE: Multidetector CT imaging of the chest was performed using the standard protocol during bolus administration of intravenous contrast. Multiplanar CT image reconstructions and MIPs were obtained to evaluate the vascular anatomy.  CONTRAST: 13mL OMNIPAQUE IOHEXOL 350 MG/ML SOLN  COMPARISON: Chest x-ray 04/16/2014  FINDINGS: Reduced dose of contrast was administered due to borderline GFR clearance. Opacification of the pulmonary arteries is less than optimal, but no central, large or moderate-sized pulmonary embolus noted to the segmental level. The smaller peripheral subsegmental branches cannot be completely cleared of pulmonary emboli. I see no suspicious areas.  Heart is normal size. Aorta is normal caliber. No mediastinal, hilar,  or axillary adenopathy. Chest wall soft tissues are unremarkable.  Areas of scarring within the lung bases in the right middle lobe and lingula. Linear densities also posteriorly in the left lower lobe, likely scarring. No acute airspace opacities. No effusions.  No acute bony abnormality.  Review of the MIP images confirms the above findings.  IMPRESSION: Slightly suboptimal opacification of the pulmonary arteries given the necessary reduced does of contrast due to the patient's borderline GFR. However, no evidence for central, large or moderate-sized pulmonary emboli to the segmental branch level.  Areas of scarring in the lung bases.  No acute findings.  Called and went over CT findings.  He is relieved, will plan to treat for gas and allergies as per pt instructions.  Will check in on him tomorrow with the rest of his labs  Signed Lamar Blinks, MD

## 2014-04-20 ENCOUNTER — Encounter: Payer: Self-pay | Admitting: Internal Medicine

## 2014-04-20 DIAGNOSIS — R609 Edema, unspecified: Secondary | ICD-10-CM

## 2014-04-20 NOTE — Addendum Note (Signed)
Addended by: Lamar Blinks C on: 04/20/2014 05:14 PM   Modules accepted: Orders

## 2014-04-21 NOTE — Telephone Encounter (Signed)
Called him back- I am concerned about his swelling.  He reports he has had nephrotic syndrome in the past; he will come in tomorrow for a recheck BMP and UA/micro.  He has just a little swelling around his eyes, and in both legs

## 2014-04-22 ENCOUNTER — Other Ambulatory Visit (INDEPENDENT_AMBULATORY_CARE_PROVIDER_SITE_OTHER): Payer: Medicare Other | Admitting: Radiology

## 2014-04-22 DIAGNOSIS — R609 Edema, unspecified: Secondary | ICD-10-CM

## 2014-04-22 DIAGNOSIS — R142 Eructation: Secondary | ICD-10-CM

## 2014-04-22 DIAGNOSIS — R140 Abdominal distension (gaseous): Secondary | ICD-10-CM

## 2014-04-22 DIAGNOSIS — R143 Flatulence: Secondary | ICD-10-CM

## 2014-04-22 DIAGNOSIS — R141 Gas pain: Secondary | ICD-10-CM

## 2014-04-22 LAB — BASIC METABOLIC PANEL
BUN: 10 mg/dL (ref 6–23)
CALCIUM: 8.4 mg/dL (ref 8.4–10.5)
CO2: 23 mEq/L (ref 19–32)
Chloride: 105 mEq/L (ref 96–112)
Creat: 1.03 mg/dL (ref 0.50–1.35)
GLUCOSE: 95 mg/dL (ref 70–99)
Potassium: 5.6 mEq/L — ABNORMAL HIGH (ref 3.5–5.3)
SODIUM: 139 meq/L (ref 135–145)

## 2014-04-22 LAB — POCT UA - MICROSCOPIC ONLY
Bacteria, U Microscopic: NEGATIVE
Casts, Ur, LPF, POC: NEGATIVE
Crystals, Ur, HPF, POC: NEGATIVE
Epithelial cells, urine per micros: NEGATIVE
Mucus, UA: NEGATIVE
YEAST UA: NEGATIVE

## 2014-04-22 LAB — POCT URINALYSIS DIPSTICK
Bilirubin, UA: NEGATIVE
GLUCOSE UA: NEGATIVE
Ketones, UA: NEGATIVE
Leukocytes, UA: NEGATIVE
NITRITE UA: NEGATIVE
Protein, UA: >=300
Spec Grav, UA: 1.02
Urobilinogen, UA: 0.2
pH, UA: 6.5

## 2014-04-22 NOTE — Progress Notes (Signed)
Pt here for labs only. 

## 2014-04-23 ENCOUNTER — Telehealth: Payer: Self-pay | Admitting: Family Medicine

## 2014-04-23 DIAGNOSIS — N049 Nephrotic syndrome with unspecified morphologic changes: Secondary | ICD-10-CM

## 2014-04-23 DIAGNOSIS — R609 Edema, unspecified: Secondary | ICD-10-CM

## 2014-04-23 MED ORDER — FUROSEMIDE 20 MG PO TABS: ORAL_TABLET | ORAL | Status: DC

## 2014-04-23 NOTE — Telephone Encounter (Signed)
Discussed with him on the phone.  It does appear that he again has the nephrotic syndrome, as he has proteinuria, hypoalbuminemia and edema. He has been seen by [facility] in the past, but is not able to recall who he used to see there.  Also recalls that he may have had a renal bx in the past per urology. In any case will start lasix 20 qd or BID as needed for edema and plan to have him see nephrology as soon as possible.

## 2014-04-25 ENCOUNTER — Other Ambulatory Visit: Payer: Self-pay | Admitting: Family Medicine

## 2014-04-25 ENCOUNTER — Encounter: Payer: Self-pay | Admitting: Internal Medicine

## 2014-04-25 DIAGNOSIS — N049 Nephrotic syndrome with unspecified morphologic changes: Secondary | ICD-10-CM

## 2014-05-07 ENCOUNTER — Encounter: Payer: Self-pay | Admitting: Internal Medicine

## 2014-05-09 ENCOUNTER — Encounter: Payer: Self-pay | Admitting: Internal Medicine

## 2014-05-24 ENCOUNTER — Other Ambulatory Visit: Payer: Self-pay | Admitting: Internal Medicine

## 2014-05-24 ENCOUNTER — Other Ambulatory Visit: Payer: Self-pay | Admitting: Family Medicine

## 2014-05-27 NOTE — Telephone Encounter (Signed)
Ok to rf 2 more times

## 2014-05-30 ENCOUNTER — Encounter: Payer: Self-pay | Admitting: Internal Medicine

## 2014-05-30 ENCOUNTER — Encounter: Payer: Self-pay | Admitting: Family Medicine

## 2014-05-30 ENCOUNTER — Ambulatory Visit (INDEPENDENT_AMBULATORY_CARE_PROVIDER_SITE_OTHER): Payer: Medicare Other | Admitting: Family Medicine

## 2014-05-30 VITALS — BP 124/82 | HR 85 | Temp 97.5°F | Resp 16 | Ht 65.0 in | Wt 140.6 lb

## 2014-05-30 DIAGNOSIS — E039 Hypothyroidism, unspecified: Secondary | ICD-10-CM

## 2014-05-30 DIAGNOSIS — R809 Proteinuria, unspecified: Secondary | ICD-10-CM | POA: Insufficient documentation

## 2014-05-30 DIAGNOSIS — R141 Gas pain: Secondary | ICD-10-CM

## 2014-05-30 DIAGNOSIS — R140 Abdominal distension (gaseous): Secondary | ICD-10-CM

## 2014-05-30 DIAGNOSIS — R143 Flatulence: Secondary | ICD-10-CM

## 2014-05-30 DIAGNOSIS — J45909 Unspecified asthma, uncomplicated: Secondary | ICD-10-CM

## 2014-05-30 DIAGNOSIS — G4700 Insomnia, unspecified: Secondary | ICD-10-CM

## 2014-05-30 DIAGNOSIS — R142 Eructation: Secondary | ICD-10-CM

## 2014-05-30 LAB — COMPREHENSIVE METABOLIC PANEL
ALT: 24 U/L (ref 0–53)
AST: 24 U/L (ref 0–37)
Albumin: 4.5 g/dL (ref 3.5–5.2)
Alkaline Phosphatase: 66 U/L (ref 39–117)
BILIRUBIN TOTAL: 0.7 mg/dL (ref 0.2–1.2)
BUN: 14 mg/dL (ref 6–23)
CO2: 24 mEq/L (ref 19–32)
Calcium: 9.9 mg/dL (ref 8.4–10.5)
Chloride: 105 mEq/L (ref 96–112)
Creat: 1.03 mg/dL (ref 0.50–1.35)
GLUCOSE: 105 mg/dL — AB (ref 70–99)
Potassium: 4.9 mEq/L (ref 3.5–5.3)
Sodium: 139 mEq/L (ref 135–145)
Total Protein: 7.3 g/dL (ref 6.0–8.3)

## 2014-05-30 LAB — CBC
HCT: 44.9 % (ref 39.0–52.0)
Hemoglobin: 15.7 g/dL (ref 13.0–17.0)
MCH: 30.8 pg (ref 26.0–34.0)
MCHC: 35 g/dL (ref 30.0–36.0)
MCV: 88 fL (ref 78.0–100.0)
PLATELETS: 245 10*3/uL (ref 150–400)
RBC: 5.1 MIL/uL (ref 4.22–5.81)
RDW: 13.2 % (ref 11.5–15.5)
WBC: 7.8 10*3/uL (ref 4.0–10.5)

## 2014-05-30 MED ORDER — BECLOMETHASONE DIPROPIONATE 80 MCG/ACT IN AERS: 1.0000 | INHALATION_SPRAY | Freq: Two times a day (BID) | RESPIRATORY_TRACT | Status: DC

## 2014-05-30 MED ORDER — LEVOTHYROXINE SODIUM 100 MCG PO TABS: ORAL_TABLET | ORAL | Status: DC

## 2014-05-30 MED ORDER — ZOLPIDEM TARTRATE 10 MG PO TABS: ORAL_TABLET | ORAL | Status: DC

## 2014-05-30 NOTE — Patient Instructions (Signed)
Try adding the qvar inhaler twice a day in hopes of using the albuterol less.  Let me know how this does for you over the next couple of weeks. Also, try reducing your furosemide to once a day, and then try stopping it entirely to see how things go.  Assuming your protein in the urine is resolved you may not need this any longer. I will be in touch with your labs asap.  I will also look into the best test to evaluate your abdominal bloating (ultrasound vs MRI) and will be in touch

## 2014-05-30 NOTE — Telephone Encounter (Signed)
Called in.

## 2014-05-30 NOTE — Progress Notes (Signed)
[facility] [address], [place] [address] [phone]  Date:  05/30/2014   Name:  Shawn Santos   DOB:  July 29, 1946   MRN:  [id number]  PCP:  Kennon Portela, MD    Chief Complaint: Hypertension, Hypothyroidism and Medication Refill   History of Present Illness:  Shawn Santos is a 68 y.o. very pleasant male patient who presents with the following:  Here today for a recheck.  He was seen here in April with non- specific sx that turned out to be due to recurrent proteinuria.  At his recent visit with Dr. Cheron Schaumann his urine appeared negative. He is still using the lasix and his LE edema is resolved.  He does still feel that he has some bloating in his abdomen.   He does feel that he is urinating more.  Swelling in his legs is gone.  He is taking the furosemide twice a day currently.    He has a colonoscopy scheduled in August 2015.   He is using albuterol as needed- he notes he needs it more lately.  He will notice "tightness in the chest."  This can occur up to 5x a day, and is resolved with albuterol. He has a history of asthma since childhood.  He does not have a controller medication. He does take zolpidem every night and needs a refill He is stable on his dose of thyroid medication.    Negative abdominal ultrasound 11/2012 Normal pancreatic enzymes in April  Wt Readings from Last 3 Encounters:  05/30/14 140 lb 9.6 oz (63.776 kg)  04/16/14 141 lb 3.2 oz (64.048 kg)  12/09/13 136 lb 3.2 oz (61.78 kg)     Patient Active Problem List   Diagnosis Date Noted  . Proteinuria 05/30/2014  . Eosinophilia 02/01/2013  . Elevated PSA 02/01/2013  . HTN (hypertension) 07/27/2012  . Insomnia 07/27/2012  . Hypothyroid 07/27/2012  . ANEMIA, IRON DEFICIENCY 04/18/2008  . CELIAC SPRUE 04/18/2008  . DUODENITIS, WITHOUT HEMORRHAGE 06/02/2007    Past Medical History  Diagnosis Date  . Hypertension   . Thyroid disease   . Insomnia   . PSA elevation   .  Celiac disease     Past Surgical History  Procedure Laterality Date  . Eye surgery      History  Substance Use Topics  . Smoking status: Never Smoker   . Smokeless tobacco: Never Used  . Alcohol Use: No     Comment: RARELY BEER    Family History  Problem Relation Age of Onset  . Alcohol abuse Mother   . COPD Father   . Diabetes Son     No Known Allergies  Medication list has been reviewed and updated.  Current Outpatient Prescriptions on File Prior to Visit  Medication Sig Dispense Refill  . albuterol (PROVENTIL HFA;VENTOLIN HFA) 108 (90 BASE) MCG/ACT inhaler Inhale 2 puffs into the lungs every 6 (six) hours as needed for wheezing or shortness of breath.  1 Inhaler  6  . aspirin 81 MG tablet Take 81 mg by mouth daily.      Marland Kitchen atorvastatin (LIPITOR) 10 MG tablet Take 1 tablet (10 mg total) by mouth daily.  90 tablet  3  . furosemide (LASIX) 20 MG tablet TAKE 1 TO 2 TABLETS BY MOUTH EVERY DAY AS NEEDED FOR SWELLING  60 tablet  0  . levothyroxine (SYNTHROID, LEVOTHROID) 100 MCG tablet TAKE 1 TABLET EVERY DAY  30 tablet  3  .  lisinopril (PRINIVIL,ZESTRIL) 5 MG tablet TAKE 1 TABLET BY MOUTH EVERY DAY FOR HIGH BLOOD PRESSURE  90 tablet  1  . Multiple Vitamin (MULTI VITAMIN DAILY PO) Take by mouth.      . zolpidem (AMBIEN) 10 MG tablet TAKE 1 TABLET AT BEDTIME AS NEEDED FOR SLEEP  90 tablet  1  . ALPRAZolam (XANAX) 1 MG tablet 1/2-1 hs prn  30 tablet  0  . Cholecalciferol (D3 ADULT PO) Take 2,000 Int'l Units by mouth daily.      Marland Kitchen HYDROcodone-acetaminophen (VICODIN) 5-500 MG per tablet Take 1 tablet by mouth every 8 (eight) hours as needed for pain.  30 tablet  0  . levothyroxine (SYNTHROID, LEVOTHROID) 75 MCG tablet TAKE 1 TABLET EVERY DAY  90 tablet  2  . methocarbamol (ROBAXIN-750) 750 MG tablet Take 1 tablet (750 mg total) by mouth 4 (four) times daily.  40 tablet  1   No current facility-administered medications on file prior to visit.    Review of Systems:  As per HPI-  otherwise negative.   Physical Examination: Filed Vitals:   05/30/14 1003  BP: 124/82  Pulse: 85  Temp: 97.5 F (36.4 C)  Resp: 16   Filed Vitals:   05/30/14 1003  Height: 5\' 5"  (1.651 m)  Weight: 140 lb 9.6 oz (63.776 kg)   Body mass index is 23.4 kg/(m^2). Ideal Body Weight: Weight in (lb) to have BMI = 25: 149.9  GEN: WDWN, NAD, Non-toxic, A & O x 3, looks well and fit for age 17: Atraumatic, Normocephalic. Neck supple. No masses, No LAD. Ears and Nose: No external deformity. CV: RRR, No M/G/R. No JVD. No thrill. No extra heart sounds. PULM: CTA B, no wheezes, crackles, rhonchi. No retractions. No resp. distress. No accessory muscle use. ABD: S, NT, ND, +BS. No rebound. No HSM. EXTR: No c/c/e NEURO Normal gait.  PSYCH: Normally interactive. Conversant. Not depressed or anxious appearing.  Calm demeanor.    Assessment and Plan: Insomnia - Plan: zolpidem (AMBIEN) 10 MG tablet  Asthma, chronic - Plan: beclomethasone (QVAR) 80 MCG/ACT inhaler  Unspecified hypothyroidism - Plan: levothyroxine (SYNTHROID, LEVOTHROID) 100 MCG tablet  Abdominal bloating - Plan: Comprehensive metabolic panel, CBC  Refilled his ambien.   Start qvar as a controller medication for his asthma sx.  He will let me know if this is helpful Refilled his synthroid. Recent TSH normal in April of this year Persistent abdominal bloating.  This was thought to be due to his proteinuria and likely nephrotic syndrome.  At his recent nephrology visit his proteinuria was resolved.  His LE edema is resolved, although he is still using lasix.  We plan to taper off of this and see if edema returns.  His feeling of abdominal bloating persists.  Concern about other etiology- pancreatic cancer is a concern of his.  I will look into [place] vs CT imaging and he will let me know his preference.    Will plan further follow- up pending labs.  Results for orders placed in visit on 05/30/14  COMPREHENSIVE METABOLIC PANEL       Result Value Ref Range   Sodium 139  135 - 145 mEq/L   Potassium 4.9  3.5 - 5.3 mEq/L   Chloride 105  96 - 112 mEq/L   CO2 24  19 - 32 mEq/L   Glucose, Bld 105 (*) 70 - 99 mg/dL   BUN 14  6 - 23 mg/dL   Creat 1.03  0.50 - 1.35  mg/dL   Total Bilirubin 0.7  0.2 - 1.2 mg/dL   Alkaline Phosphatase 66  39 - 117 U/L   AST 24  0 - 37 U/L   ALT 24  0 - 53 U/L   Total Protein 7.3  6.0 - 8.3 g/dL   Albumin 4.5  3.5 - 5.2 g/dL   Calcium 9.9  8.4 - 10.5 mg/dL  CBC      Result Value Ref Range   WBC 7.8  4.0 - 10.5 K/uL   RBC 5.10  4.22 - 5.81 MIL/uL   Hemoglobin 15.7  13.0 - 17.0 g/dL   HCT 44.9  39.0 - 52.0 %   MCV 88.0  78.0 - 100.0 fL   MCH 30.8  26.0 - 34.0 pg   MCHC 35.0  30.0 - 36.0 g/dL   RDW 13.2  11.5 - 15.5 %   Platelets 245  150 - 400 K/uL   Signed Lamar Blinks, MD

## 2014-06-01 ENCOUNTER — Telehealth: Payer: Self-pay

## 2014-06-01 NOTE — Telephone Encounter (Signed)
PA needed for zolpidem. Completed form on covermymeds.

## 2014-06-03 NOTE — Telephone Encounter (Signed)
PA approved through 06/01/15. Notified pharm.

## 2014-06-05 ENCOUNTER — Other Ambulatory Visit: Payer: Self-pay | Admitting: Internal Medicine

## 2014-06-06 ENCOUNTER — Ambulatory Visit: Payer: Medicare Other | Admitting: Family Medicine

## 2014-06-13 ENCOUNTER — Encounter: Payer: Self-pay | Admitting: Family Medicine

## 2014-06-13 NOTE — Telephone Encounter (Signed)
Hi Bob- at this point I think we should consider a CT scan to make sure all is well in your belly.  What do you think about this? [place]

## 2014-06-27 ENCOUNTER — Other Ambulatory Visit: Payer: Self-pay | Admitting: Family Medicine

## 2014-06-28 NOTE — Telephone Encounter (Signed)
Plan at last OV was to taper off the lasix and pt's report on 6/15 was that his edema had resolved and not returned since lasix was stopped. Called pt to see if status has changed and reason for req for RF. Pt stated that he still has no LE edema and he guessed asking for the RF was just "me being me". I advised that if Lasix is NOT needed it is better for him and his kidneys not to take it. Pt agreed he will stay off of it and CB if status changes and edema returns. I will deny RF.

## 2014-07-16 ENCOUNTER — Other Ambulatory Visit: Payer: Self-pay | Admitting: Family Medicine

## 2014-07-18 NOTE — Telephone Encounter (Signed)
Dr Lorelei Pont, do you want to RF? It sounds in OV notes that you wanted pt to taper off this and have some scans done?

## 2014-07-19 ENCOUNTER — Other Ambulatory Visit: Payer: Self-pay | Admitting: Internal Medicine

## 2014-07-19 DIAGNOSIS — I10 Essential (primary) hypertension: Secondary | ICD-10-CM

## 2014-07-20 NOTE — Telephone Encounter (Signed)
Dr Lorelei Pont, you saw pt in June for check-up and pt had come in for BP check along w/other chronic Dxs, but don't see this med discussed. Do you want to RF?

## 2014-07-24 ENCOUNTER — Encounter: Payer: Self-pay | Admitting: Internal Medicine

## 2014-07-24 DIAGNOSIS — R809 Proteinuria, unspecified: Secondary | ICD-10-CM

## 2014-07-26 NOTE — Telephone Encounter (Signed)
Hello!  How much qvar are you using?  You can increase to 2 or 3 inhalations twice a day.  How often do you need the albuterol generally?  Concerning your bloating: You have a GI appointment soon I think for your colonoscopy.  Assuming this is normal it will be reassuring.  However, let's go ahead and do a CT now as well if ok with you.  I would like to check on your kidney function and urine prior to giving you IV contrast however.  Could you come by for a lab visit only in the next few days to check these things and then I will arrange a CT for you?  [place]

## 2014-08-01 ENCOUNTER — Encounter: Payer: Self-pay | Admitting: *Deleted

## 2014-08-02 ENCOUNTER — Ambulatory Visit (AMBULATORY_SURGERY_CENTER): Payer: Self-pay | Admitting: *Deleted

## 2014-08-02 VITALS — Ht 66.0 in | Wt 147.6 lb

## 2014-08-02 DIAGNOSIS — Z1211 Encounter for screening for malignant neoplasm of colon: Secondary | ICD-10-CM

## 2014-08-02 MED ORDER — NA SULFATE-K SULFATE-MG SULF 17.5-3.13-1.6 GM/177ML PO SOLN: ORAL | Status: DC

## 2014-08-02 NOTE — Progress Notes (Signed)
No egg or soy allergy  No anesthesia or intubation problems per pt  No diet medications taken  Registered in EMMI  Pt states he has no metal in his body 

## 2014-08-03 ENCOUNTER — Ambulatory Visit (INDEPENDENT_AMBULATORY_CARE_PROVIDER_SITE_OTHER): Payer: Medicare Other | Admitting: Family Medicine

## 2014-08-03 VITALS — BP 122/72 | HR 98 | Temp 98.1°F | Resp 16 | Ht 65.0 in | Wt 146.0 lb

## 2014-08-03 DIAGNOSIS — J45909 Unspecified asthma, uncomplicated: Secondary | ICD-10-CM

## 2014-08-03 DIAGNOSIS — J4540 Moderate persistent asthma, uncomplicated: Secondary | ICD-10-CM

## 2014-08-03 DIAGNOSIS — Z87448 Personal history of other diseases of urinary system: Secondary | ICD-10-CM

## 2014-08-03 DIAGNOSIS — R1900 Intra-abdominal and pelvic swelling, mass and lump, unspecified site: Secondary | ICD-10-CM

## 2014-08-03 DIAGNOSIS — R198 Other specified symptoms and signs involving the digestive system and abdomen: Secondary | ICD-10-CM

## 2014-08-03 DIAGNOSIS — R141 Gas pain: Secondary | ICD-10-CM

## 2014-08-03 DIAGNOSIS — R142 Eructation: Secondary | ICD-10-CM

## 2014-08-03 DIAGNOSIS — R143 Flatulence: Secondary | ICD-10-CM

## 2014-08-03 DIAGNOSIS — R140 Abdominal distension (gaseous): Secondary | ICD-10-CM

## 2014-08-03 DIAGNOSIS — R0602 Shortness of breath: Secondary | ICD-10-CM

## 2014-08-03 LAB — POCT URINALYSIS DIPSTICK
BILIRUBIN UA: NEGATIVE
Glucose, UA: NEGATIVE
KETONES UA: NEGATIVE
Leukocytes, UA: NEGATIVE
NITRITE UA: NEGATIVE
PH UA: 5
Protein, UA: NEGATIVE
Spec Grav, UA: 1.01
Urobilinogen, UA: 0.2

## 2014-08-03 MED ORDER — FLUTICASONE-SALMETEROL 100-50 MCG/DOSE IN AEPB: 1.0000 | INHALATION_SPRAY | Freq: Two times a day (BID) | RESPIRATORY_TRACT | Status: DC

## 2014-08-03 MED ORDER — FLUTICASONE FUROATE 100 MCG/ACT IN AEPB: 1.0000 | INHALATION_SPRAY | Freq: Two times a day (BID) | RESPIRATORY_TRACT | Status: DC

## 2014-08-03 NOTE — Patient Instructions (Addendum)
Try using the Advair inhaler twice a day INSTEAD of the qvar.   I will be in touch with your labs, and will schedule a CT of your abdomen and pelvis for you in the next week or so.    Please let me know if you notice any improvement with the new inhaler or not.

## 2014-08-03 NOTE — Progress Notes (Signed)
[facility] [address] [place] [address] [phone]  Date:  08/03/2014   Name:  Shawn Santos   DOB:  Aug 02, 1946   MRN:  [id number]  PCP:  Lamar Blinks, MD    Chief Complaint: Follow-up   History of Present Illness:  Shawn Santos is a 68 y.o. very pleasant male patient who presents with the following:  Here today for a recheck He continues to notice some bloating in his abdomen. This had gotten better with diuretic use when he was first dx with nephrotic syndrome.   Also, he continues to notice sx of asthma.  He is using his albuterol 5 or 6 times a day.  Qvar did not seem to help him much.  He does get temporary relief from the albuterol but his sx keep coming back.  He is not wheezing a lot, but does feel "like its a little harder to breathe.";  His colonoscopy is planned for 8/18 per Dr. Jenne Pane   Patient Active Problem List   Diagnosis Date Noted  . Proteinuria 05/30/2014  . Eosinophilia 02/01/2013  . Elevated PSA 02/01/2013  . HTN (hypertension) 07/27/2012  . Insomnia 07/27/2012  . Hypothyroid 07/27/2012  . ANEMIA, IRON DEFICIENCY 04/18/2008  . CELIAC SPRUE 04/18/2008  . DUODENITIS, WITHOUT HEMORRHAGE 06/02/2007    Past Medical History  Diagnosis Date  . Hypertension   . Thyroid disease   . Insomnia   . PSA elevation   . Celiac disease   . Allergy   . Asthma   . Hyperlipidemia   . Chronic kidney disease     nephrotic syndrome    Past Surgical History  Procedure Laterality Date  . Eye surgery      cataract- both eyes  . Colonoscopy    . Upper gastrointestinal endoscopy      History  Substance Use Topics  . Smoking status: Never Smoker   . Smokeless tobacco: Never Used  . Alcohol Use: No     Comment: RARELY BEER    Family History  Problem Relation Age of Onset  . Alcohol abuse Mother   . COPD Father   . Diabetes Son   . Colon cancer Neg Hx   . Esophageal cancer Neg Hx   . Rectal cancer Neg Hx   . Stomach  cancer Neg Hx     No Known Allergies  Medication list has been reviewed and updated.  Current Outpatient Prescriptions on File Prior to Visit  Medication Sig Dispense Refill  . albuterol (PROVENTIL HFA;VENTOLIN HFA) 108 (90 BASE) MCG/ACT inhaler Inhale 2 puffs into the lungs every 6 (six) hours as needed for wheezing or shortness of breath.  1 Inhaler  6  . aspirin 81 MG tablet Take 81 mg by mouth daily.      Marland Kitchen atorvastatin (LIPITOR) 10 MG tablet Take 1 tablet (10 mg total) by mouth daily.  90 tablet  3  . beclomethasone (QVAR) 80 MCG/ACT inhaler Inhale 1 puff into the lungs 2 (two) times daily.  1 Inhaler  12  . levothyroxine (SYNTHROID, LEVOTHROID) 100 MCG tablet TAKE 1 TABLET EVERY DAY  90 tablet  3  . lisinopril (PRINIVIL,ZESTRIL) 5 MG tablet TAKE 1 TABLET BY MOUTH EVERY DAY FOR HIGH BLOOD PRESSURE  90 tablet  3  . Multiple Vitamin (MULTI VITAMIN DAILY PO) Take by mouth.      . Na Sulfate-K Sulfate-Mg Sulf (SUPREP BOWEL PREP) SOLN Suprep as directed, no substitutions  354 mL  0  . zolpidem (AMBIEN) 10 MG tablet TAKE 1 TABLET AT BEDTIME AS NEEDED FOR SLEEP  90 tablet  1  . ALPRAZolam (XANAX) 1 MG tablet 1/2-1 hs prn  30 tablet  0  . Cholecalciferol (D3 ADULT PO) Take 2,000 Int'l Units by mouth daily.       No current facility-administered medications on file prior to visit.    Review of Systems:  As per HPI- otherwise negative.   Physical Examination: Filed Vitals:   08/03/14 1127  BP: 122/72  Pulse: 98  Temp: 98.1 F (36.7 C)  Resp: 16   Filed Vitals:   08/03/14 1127  Height: 5\' 5"  (1.651 m)  Weight: 146 lb (66.225 kg)   Body mass index is 24.3 kg/(m^2). Ideal Body Weight: Weight in (lb) to have BMI = 25: 149.9  GEN: WDWN, NAD, Non-toxic, A & O x 3 HEENT: Atraumatic, Normocephalic. Neck supple. No masses, No LAD. Ears and Nose: No external deformity. CV: RRR, No M/G/R. No JVD. No thrill. No extra heart sounds. PULM: CTA B, no wheezes, crackles, rhonchi. No  retractions. No resp. distress. No accessory muscle use. ABD: S, NT, ND, +BS. No rebound. No HSM.  His belly does seem larger than I would expect for someone of his build.  He agrees that his abdomen is larger than usual  EXTR: No c/c/e NEURO Normal gait.  PSYCH: Normally interactive. Conversant. Not depressed or anxious appearing.  Calm demeanor.   Results for orders placed in visit on 08/03/14  POCT URINALYSIS DIPSTICK      Result Value Ref Range   Color, UA yellow     Clarity, UA clear     Glucose, UA neg     Bilirubin, UA neg     Ketones, UA neg     Spec Grav, UA 1.010     Blood, UA trace     pH, UA 5.0     Protein, UA neg     Urobilinogen, UA 0.2     Nitrite, UA neg     Leukocytes, UA Negative      Assessment and Plan: History of proteinuria syndrome - Plan: POCT urinalysis dipstick  SOB (shortness of breath) - Plan: Brain natriuretic peptide  Asthma, moderate persistent, uncomplicated - Plan: Fluticasone-Salmeterol (ADVAIR) 100-50 MCG/DOSE AEPB, DISCONTINUED: Fluticasone Furoate (ARNUITY ELLIPTA) 100 MCG/ACT AEPB  Abdominal bloating  Abdominal enlargement - Plan: CT Abdomen Pelvis W Contrast    Proteinuria has not returned.  Concern about persistent abdominal bloating especially now that other swelling is resolved.  Will do a CT scan to rule- out any abdominal mass Also check BNP as he has some SOB Will try advair instead of qvar for his astham sx.    Signed Lamar Blinks, MD

## 2014-08-04 LAB — BRAIN NATRIURETIC PEPTIDE: Brain Natriuretic Peptide: 152.2 pg/mL — ABNORMAL HIGH (ref 0.0–100.0)

## 2014-08-04 NOTE — Addendum Note (Signed)
Addended by: Venetia Night on: 08/04/2014 10:33 AM   Modules accepted: Orders

## 2014-08-05 ENCOUNTER — Ambulatory Visit (HOSPITAL_COMMUNITY)
Admission: RE | Admit: 2014-08-05 | Discharge: 2014-08-05 | Disposition: A | Discharge status: Home / Self Care | Payer: Medicare Other | Source: Ambulatory Visit | Attending: Family Medicine | Admitting: Family Medicine

## 2014-08-05 DIAGNOSIS — R198 Other specified symptoms and signs involving the digestive system and abdomen: Secondary | ICD-10-CM

## 2014-08-05 DIAGNOSIS — I2510 Atherosclerotic heart disease of native coronary artery without angina pectoris: Secondary | ICD-10-CM | POA: Insufficient documentation

## 2014-08-05 DIAGNOSIS — R1900 Intra-abdominal and pelvic swelling, mass and lump, unspecified site: Secondary | ICD-10-CM | POA: Insufficient documentation

## 2014-08-05 DIAGNOSIS — K4020 Bilateral inguinal hernia, without obstruction or gangrene, not specified as recurrent: Secondary | ICD-10-CM | POA: Insufficient documentation

## 2014-08-05 LAB — POCT I-STAT CREATININE: CREATININE: 1.2 mg/dL (ref 0.50–1.35)

## 2014-08-05 MED ORDER — IOHEXOL 300 MG/ML  SOLN
100.0000 mL | Freq: Once | INTRAMUSCULAR | Status: AC | PRN
  Administered 2014-08-05: 100 mL via INTRAVENOUS

## 2014-08-09 ENCOUNTER — Encounter: Payer: Self-pay | Admitting: Family Medicine

## 2014-08-13 ENCOUNTER — Encounter: Payer: Self-pay | Admitting: Family Medicine

## 2014-08-13 DIAGNOSIS — R0602 Shortness of breath: Secondary | ICD-10-CM

## 2014-08-16 ENCOUNTER — Encounter: Payer: Self-pay | Admitting: Gastroenterology

## 2014-08-16 ENCOUNTER — Ambulatory Visit (AMBULATORY_SURGERY_CENTER): Payer: Medicare Other | Admitting: Gastroenterology

## 2014-08-16 ENCOUNTER — Encounter: Payer: Self-pay | Admitting: Family Medicine

## 2014-08-16 VITALS — BP 102/72 | HR 87 | Temp 98.2°F | Resp 13 | Ht 66.0 in | Wt 147.0 lb

## 2014-08-16 DIAGNOSIS — Z1211 Encounter for screening for malignant neoplasm of colon: Secondary | ICD-10-CM

## 2014-08-16 MED ORDER — SODIUM CHLORIDE 0.9 % IV SOLN: 500.0000 mL | INTRAVENOUS | Status: DC

## 2014-08-16 NOTE — Op Note (Signed)
[facility]  [address] [place], [address]   COLONOSCOPY PROCEDURE REPORT  PATIENT: Shawn, Santos  MR#: [id number] BIRTHDATE: [birth year] , 69  yrs. old GENDER: Male ENDOSCOPIST: Inda Castle, MD REFERRED HY:IFOYDXA Copland, M.D. PROCEDURE DATE:  08/16/2014 PROCEDURE:   Colonoscopy, diagnostic First Screening Colonoscopy - Avg.  risk and is 50 yrs.  old or older - No.  Prior Negative Screening - Now for repeat screening. 10 or more years since last screening  History of Adenoma - Now for follow-up colonoscopy & has been > or = to 3 yrs.  N/A  Polyps Removed Today? No.  Recommend repeat exam, <10 yrs? No. ASA CLASS:   Class II INDICATIONS:Average risk patient for colon cancer. MEDICATIONS: MAC sedation, administered by CRNA and Propofol (Diprivan) 180 mg IV  DESCRIPTION OF PROCEDURE:   After the risks benefits and alternatives of the procedure were thoroughly explained, informed consent was obtained.  A digital rectal exam revealed no abnormalities of the rectum.   The LB [id number]  endoscope was introduced through the anus and advanced to the cecum, which was identified by both the appendix and ileocecal valve. No adverse events experienced.   The quality of the prep was Suprep good  The instrument was then slowly withdrawn as the colon was fully examined.      COLON FINDINGS: A normal appearing cecum, ileocecal valve, and appendiceal orifice were identified.  The ascending, hepatic flexure, transverse, splenic flexure, descending, sigmoid colon and rectum appeared unremarkable.  No polyps or cancers were seen. Retroflexed views revealed no abnormalities. The time to cecum=2 minutes 49 seconds.  Withdrawal time=6 minutes 23 seconds.  The scope was withdrawn and the procedure completed. COMPLICATIONS: There were no complications.  ENDOSCOPIC IMPRESSION: Normal colon  RECOMMENDATIONS: Continue current colorectal screening recommendations  for "routine risk" patients with a repeat colonoscopy in 10 years.   eSigned:  Inda Castle, MD 08/16/2014 10:55 AM   cc:

## 2014-08-16 NOTE — Progress Notes (Signed)
Report to PACU, RN, vss, BBS= Clear.  

## 2014-08-16 NOTE — Patient Instructions (Addendum)
YOU HAD AN ENDOSCOPIC PROCEDURE TODAY AT THE [facility]: Refer to the procedure report that was given to you for any specific questions about what was found during the examination.  If the procedure report does not answer your questions, please call your gastroenterologist to clarify.  If you requested that your care partner not be given the details of your procedure findings, then the procedure report has been included in a sealed envelope for you to review at your convenience later.  YOU SHOULD EXPECT: Some feelings of bloating in the abdomen. Passage of more gas than usual.  Walking can help get rid of the air that was put into your GI tract during the procedure and reduce the bloating. If you had a lower endoscopy (such as a colonoscopy or flexible sigmoidoscopy) you may notice spotting of blood in your stool or on the toilet paper. If you underwent a bowel prep for your procedure, then you may not have a normal bowel movement for a few days.  DIET: Your first meal following the procedure should be a light meal and then it is ok to progress to your normal diet.  A half-sandwich or bowl of soup is an example of a good first meal.  Heavy or fried foods are harder to digest and may make you feel nauseous or bloated.  Likewise meals heavy in dairy and vegetables can cause extra gas to form and this can also increase the bloating.  Drink plenty of fluids but you should avoid alcoholic beverages for 24 hours.  ACTIVITY: Your care partner should take you home directly after the procedure.  You should plan to take it easy, moving slowly for the rest of the day.  You can resume normal activity the day after the procedure however you should NOT DRIVE or use heavy machinery for 24 hours (because of the sedation medicines used during the test).    SYMPTOMS TO REPORT IMMEDIATELY: A gastroenterologist can be reached at any hour.  During normal business hours, 8:30 AM to 5:00 PM Monday through Friday,  call [phone].  After hours and on weekends, please call the GI answering service at [phone] who will take a message and have the physician on call contact you.   Following lower endoscopy (colonoscopy or flexible sigmoidoscopy):  Excessive amounts of blood in the stool  Significant tenderness or worsening of abdominal pains  Swelling of the abdomen that is new, acute  Fever of 100F or higher    FOLLOW UP: If any biopsies were taken you will be contacted by phone or by letter within the next 1-3 weeks.  Call your gastroenterologist if you have not heard about the biopsies in 3 weeks.  Our staff will call the home number listed on your records the next business day following your procedure to check on you and address any questions or concerns that you may have at that time regarding the information given to you following your procedure. This is a courtesy call and so if there is no answer at the home number and we have not heard from you through the emergency physician on call, we will assume that you have returned to your regular daily activities without incident.  SIGNATURES/CONFIDENTIALITY: You and/or your care partner have signed paperwork which will be entered into your electronic medical record.  These signatures attest to the fact that that the information above on your After Visit Summary has been reviewed and is understood.  Full responsibility of the confidentiality   of this discharge information lies with you and/or your care-partner.     

## 2014-08-17 ENCOUNTER — Telehealth: Payer: Self-pay | Admitting: *Deleted

## 2014-08-17 NOTE — Telephone Encounter (Signed)
  Follow up Call-  Call back number 08/16/2014  Post procedure Call Back phone  # [phone]  Permission to leave phone message No     Patient questions:  Do you have a fever, pain , or abdominal swelling? No. Pain Score  0 *  Have you tolerated food without any problems? Yes.    Have you been able to return to your normal activities? Yes.    Do you have any questions about your discharge instructions: Diet   No. Medications  No. Follow up visit  No.  Do you have questions or concerns about your Care? No.  Actions: * If pain score is 4 or above: No action needed, pain <4.

## 2014-08-23 ENCOUNTER — Encounter: Payer: Self-pay | Admitting: Family Medicine

## 2014-08-28 ENCOUNTER — Encounter: Payer: Self-pay | Admitting: Family Medicine

## 2014-08-30 ENCOUNTER — Other Ambulatory Visit (INDEPENDENT_AMBULATORY_CARE_PROVIDER_SITE_OTHER): Payer: Medicare Other | Admitting: Family Medicine

## 2014-08-30 DIAGNOSIS — R0602 Shortness of breath: Secondary | ICD-10-CM

## 2014-08-30 DIAGNOSIS — N049 Nephrotic syndrome with unspecified morphologic changes: Secondary | ICD-10-CM

## 2014-08-30 LAB — BASIC METABOLIC PANEL
BUN: 13 mg/dL (ref 6–23)
CALCIUM: 9.5 mg/dL (ref 8.4–10.5)
CO2: 23 mEq/L (ref 19–32)
Chloride: 105 mEq/L (ref 96–112)
Creat: 1.14 mg/dL (ref 0.50–1.35)
Glucose, Bld: 95 mg/dL (ref 70–99)
Potassium: 5 mEq/L (ref 3.5–5.3)
SODIUM: 137 meq/L (ref 135–145)

## 2014-08-30 NOTE — Progress Notes (Signed)
Patient here for labs only. 

## 2014-08-31 LAB — BRAIN NATRIURETIC PEPTIDE: BRAIN NATRIURETIC PEPTIDE: 13.5 pg/mL (ref 0.0–100.0)

## 2014-09-04 ENCOUNTER — Encounter: Payer: Self-pay | Admitting: Family Medicine

## 2014-09-06 ENCOUNTER — Telehealth: Payer: Self-pay

## 2014-09-06 NOTE — Telephone Encounter (Signed)
Message left for the patient to call back to discuss

## 2014-09-06 NOTE — Telephone Encounter (Signed)
I have left message for the patient to call back. He is having issues with bloating and weight gain.

## 2014-09-07 NOTE — Telephone Encounter (Signed)
Spoke with the patient. He reports abdominal bloating which makes him feels uncomfortable. It is worse after he eats. There have not been any BM changes or concerns per the patient. He has tried Gas-X but could not tell any improvement and stopped the medicine. Is there anything that can be suggested over the phone or does he need a face to face evaluation?

## 2014-09-09 ENCOUNTER — Encounter: Payer: Self-pay | Admitting: Family Medicine

## 2014-09-09 NOTE — Telephone Encounter (Signed)
Checking back on message from earlier this week. Would like to be called back to the home phone number [phone]

## 2014-09-09 NOTE — Telephone Encounter (Signed)
Patient advised. He agrees to an appointment on 09/16/14 with Alonza Bogus PA

## 2014-09-09 NOTE — Telephone Encounter (Signed)
With a h/o celiac disease I think it's best that he get evaluated.

## 2014-09-11 ENCOUNTER — Other Ambulatory Visit: Payer: Self-pay | Admitting: Family Medicine

## 2014-09-11 DIAGNOSIS — R609 Edema, unspecified: Secondary | ICD-10-CM

## 2014-09-13 NOTE — Telephone Encounter (Signed)
Dr Lorelei Pont, this pt looks like he has some rather complicated issues and you were worried about his abdominal swelling at last OV, so I wanted to check w/you bf refilling the lasix.

## 2014-09-14 ENCOUNTER — Ambulatory Visit: Payer: Medicare Other | Admitting: Nurse Practitioner

## 2014-09-16 ENCOUNTER — Encounter: Payer: Self-pay | Admitting: Gastroenterology

## 2014-09-16 ENCOUNTER — Ambulatory Visit (INDEPENDENT_AMBULATORY_CARE_PROVIDER_SITE_OTHER): Payer: Medicare Other | Admitting: Gastroenterology

## 2014-09-16 VITALS — BP 128/64 | HR 88 | Ht 65.0 in | Wt 148.0 lb

## 2014-09-16 DIAGNOSIS — R142 Eructation: Secondary | ICD-10-CM

## 2014-09-16 DIAGNOSIS — R140 Abdominal distension (gaseous): Secondary | ICD-10-CM | POA: Insufficient documentation

## 2014-09-16 DIAGNOSIS — R141 Gas pain: Secondary | ICD-10-CM

## 2014-09-16 DIAGNOSIS — R143 Flatulence: Secondary | ICD-10-CM

## 2014-09-16 NOTE — Progress Notes (Signed)
     09/16/2014 Shawn Santos [id number] 06-30-1946   History of Present Illness:  This is a pleasant 68 year old male who is known to Dr. Deatra Ina. He was diagnosed with celiac disease in 2008 and reports that he follows his gluten free diet religiously. He had a colonoscopy in August 2015, which was normal. He reports to our office today with complaints of abdominal bloating. He says that his weight and is up about 13 pounds. He was being treated for fluid with diuretics. He says that his abdomen always feels bloated after eating. When he wakes up in the morning it feels best.  He recently had a CT scan of the abdomen and pelvis with contrast that showed only mild prostate prominence, coronary artery disease, and bilateral prominent inguinal hernias with herniation of fat only.  Recent CBC, BMP, and TSH were WNL's.  He has tried Gas-X without any significant or lasting relief.  Unsure if dairy/lactose containing products make his symptoms worse.   Current Medications, Allergies, Past Medical History, Past Surgical History, Family History and Social History were reviewed in [employer] record.   Physical Exam: BP 128/64  Pulse 88  Ht 5\' 5"  (1.651 m)  Wt 148 lb (67.132 kg)  BMI 24.63 kg/m2 General: Well developed white male in no acute distress Head: Normocephalic and atraumatic Eyes:  Sclerae anicteric, conjunctiva pink  Ears: Normal auditory acuity Lungs: Clear throughout to auscultation Heart: Regular rate and rhythm Abdomen: Soft, non-distended.  Normal bowel sounds.  Non-tender. Musculoskeletal: Symmetrical with no gross deformities  Extremities: No edema  Neurological: Alert oriented x 4, grossly non-focal Psychological:  Alert and cooperative. Normal mood and affect  Assessment and Recommendations: -Bloating:  ? If he has some lactose intolerance as well vs IBS/SIBO.  Will have him try lactose free diet for a few weeks.  If no improvement then will try  daily probiotic.  Will follow-up in 6-8 weeks.  If still no improvement then could consider trial of Xifaxan. -Celiac disease:  Follows gluten free diet religiously.

## 2014-09-16 NOTE — Patient Instructions (Signed)
Lactose free diet below for your review  Please purchase a probiotic over the counter and take as directed Examples of probiotics you can buy are: Florastor or Align   Your follow up with Dr. Deatra Ina is scheduled for 11-10-2014 at 245 pm  _________________________________________________________________________________________________________________________________  Lactose Intolerance, Adult Lactose intolerance is when the body is not able to digest lactose, a sugar found in milk and milk products. Lactose intolerance is caused by your body not producing enough of the enzyme lactase. When there is not enough lactase to digest the amount of lactose consumed, discomfort may be felt. Lactose intolerance is not a milk allergy. For most people, lactase deficiency is a condition that develops naturally over time. After about the age of 2, the body begins to produce less lactase. But many people may not experience symptoms until they are much older. CAUSES Things that can cause you to be lactose intolerant include:  Aging.  Being born without the ability to make lactase.  Certain digestive diseases.  Injuries to the small intestine. SYMPTOMS   Feeling sick to your stomach (nauseous).  Diarrhea.  Cramps.  Bloating.  Gas. Symptoms usually show up a half hour or 2 hours after eating or drinking products containing lactose. TREATMENT  No treatment can improve the body's ability to produce lactase. However, symptoms can be controlled through diet. A medicine may be given to you to take when you consume lactose-containing foods or drinks. The medicine contains the lactase enzyme, which help the body digest lactose better. HOME CARE INSTRUCTIONS  Eat or drink dairy products as told by your caregiver or dietician.  Take all medicine as directed by your caregiver.  Find lactose-free or lactose-reduced products at your local grocery store.  Talk to your caregiver or dietician to decide if  you need any dietary supplements. The following is the amount of calcium needed from the diet:  19 to 50 years: 1000 mg  Over 50 years: 1200 mg Calcium and Lactose in Common Foods Non-Dairy Products / Calcium Content (mg)  Calcium-fortified orange juice, 1 cup / 308 to 344 mg  Sardines, with edible bones, 3 oz / 270 mg  Salmon, canned, with edible bones, 3 oz / 205 mg  Soymilk, fortified, 1 cup / 200 mg  Broccoli (raw), 1 cup / 90 mg  Orange, 1 medium / 50 mg  Pinto beans,  cup / 40 mg  Tuna, canned, 3 oz / 10 mg  Lettuce greens,  cup / 10 mg Dairy Products / Calcium Content (mg) / Lactose Content (g)  Yogurt, plain, low-fat, 1 cup / 415 mg / 5 g  Milk, reduced fat, 1 cup / 295 mg / 11 g  Swiss cheese, 1 oz / 270 mg / 1 g  Ice cream,  cup / 85 mg / 6 g  Cottage cheese,  cup / 75 mg / 2 to 3 g SEEK MEDICAL CARE IF: You have no relief from your symptoms. Document Released: 12/16/2005 Document Revised: 03/09/2012 Document Reviewed: 03/18/2014 [facility] Patient Information 2015 [place], [place]. This information is not intended to replace advice given to you by your health care provider. Make sure you discuss any questions you have with your health care provider.

## 2014-09-20 NOTE — Progress Notes (Signed)
Reviewed and agree with management. Riyan D. Kaplan, M.D., FACG  

## 2014-09-25 ENCOUNTER — Encounter: Payer: Self-pay | Admitting: Family Medicine

## 2014-09-26 ENCOUNTER — Encounter: Payer: Self-pay | Admitting: Family Medicine

## 2014-10-27 ENCOUNTER — Other Ambulatory Visit: Payer: Self-pay | Admitting: Internal Medicine

## 2014-11-01 ENCOUNTER — Encounter: Payer: Self-pay | Admitting: Family Medicine

## 2014-11-01 DIAGNOSIS — G4700 Insomnia, unspecified: Secondary | ICD-10-CM

## 2014-11-01 MED ORDER — ZOLPIDEM TARTRATE 10 MG PO TABS
ORAL_TABLET | ORAL | Status: DC
Start: 2014-11-01 — End: 2014-12-05

## 2014-11-10 ENCOUNTER — Ambulatory Visit: Payer: Medicare Other | Admitting: Gastroenterology

## 2014-11-18 ENCOUNTER — Encounter: Payer: Self-pay | Admitting: *Deleted

## 2014-12-03 ENCOUNTER — Encounter: Payer: Self-pay | Admitting: Family Medicine

## 2014-12-05 ENCOUNTER — Encounter: Payer: Self-pay | Admitting: Family Medicine

## 2014-12-05 ENCOUNTER — Ambulatory Visit (INDEPENDENT_AMBULATORY_CARE_PROVIDER_SITE_OTHER): Payer: Medicare Other | Admitting: Family Medicine

## 2014-12-05 VITALS — BP 126/92 | HR 118 | Temp 97.5°F | Resp 16 | Ht 65.25 in | Wt 156.2 lb

## 2014-12-05 DIAGNOSIS — Z87448 Personal history of other diseases of urinary system: Secondary | ICD-10-CM

## 2014-12-05 DIAGNOSIS — Z23 Encounter for immunization: Secondary | ICD-10-CM

## 2014-12-05 DIAGNOSIS — E038 Other specified hypothyroidism: Secondary | ICD-10-CM

## 2014-12-05 DIAGNOSIS — R972 Elevated prostate specific antigen [PSA]: Secondary | ICD-10-CM

## 2014-12-05 DIAGNOSIS — E782 Mixed hyperlipidemia: Secondary | ICD-10-CM

## 2014-12-05 DIAGNOSIS — Z0000 Encounter for general adult medical examination without abnormal findings: Secondary | ICD-10-CM

## 2014-12-05 DIAGNOSIS — K921 Melena: Secondary | ICD-10-CM

## 2014-12-05 DIAGNOSIS — J4530 Mild persistent asthma, uncomplicated: Secondary | ICD-10-CM

## 2014-12-05 LAB — CBC
HEMATOCRIT: 47.9 % (ref 39.0–52.0)
Hemoglobin: 16.1 g/dL (ref 13.0–17.0)
MCH: 30.1 pg (ref 26.0–34.0)
MCHC: 33.6 g/dL (ref 30.0–36.0)
MCV: 89.7 fL (ref 78.0–100.0)
MPV: 9.4 fL (ref 9.4–12.4)
PLATELETS: 352 10*3/uL (ref 150–400)
RBC: 5.34 MIL/uL (ref 4.22–5.81)
RDW: 13.8 % (ref 11.5–15.5)
WBC: 10.7 10*3/uL — ABNORMAL HIGH (ref 4.0–10.5)

## 2014-12-05 LAB — LIPID PANEL
CHOLESTEROL: 330 mg/dL — AB (ref 0–200)
HDL: 66 mg/dL (ref >39)
LDL Cholesterol: 224 mg/dL — ABNORMAL HIGH (ref 0–99)
Total CHOL/HDL Ratio: 5 Ratio
Triglycerides: 202 mg/dL — ABNORMAL HIGH (ref <150)
VLDL: 40 mg/dL (ref 0–40)

## 2014-12-05 LAB — BASIC METABOLIC PANEL
BUN: 30 mg/dL — ABNORMAL HIGH (ref 6–23)
CO2: 24 mEq/L (ref 19–32)
Calcium: 8.4 mg/dL (ref 8.4–10.5)
Chloride: 103 mEq/L (ref 96–112)
Creat: 1.64 mg/dL — ABNORMAL HIGH (ref 0.50–1.35)
Glucose, Bld: 89 mg/dL (ref 70–99)
POTASSIUM: 5.1 meq/L (ref 3.5–5.3)
SODIUM: 137 meq/L (ref 135–145)

## 2014-12-05 LAB — TSH: TSH: 9.596 u[IU]/mL — ABNORMAL HIGH (ref 0.350–4.500)

## 2014-12-05 MED ORDER — MONTELUKAST SODIUM 10 MG PO TABS: 10.0000 mg | ORAL_TABLET | Freq: Every day | ORAL | Status: DC

## 2014-12-05 MED ORDER — LEVOTHYROXINE SODIUM 100 MCG PO TABS: ORAL_TABLET | ORAL | Status: DC

## 2014-12-05 MED ORDER — ATORVASTATIN CALCIUM 10 MG PO TABS: 10.0000 mg | ORAL_TABLET | Freq: Every day | ORAL | Status: DC

## 2014-12-05 NOTE — Addendum Note (Signed)
Addended by: Lamar Blinks C on: 12/05/2014 11:08 PM   Modules accepted: Orders, Medications

## 2014-12-05 NOTE — Progress Notes (Addendum)
[facility] [address] [place] [address] [phone]  Date:  12/05/2014   Name:  Shawn Santos   DOB:  [birth year]   MRN:  [id number]  PCP:  Lamar Blinks, MD    Chief Complaint: Annual Exam   History of Present Illness:  Shawn Santos is a 68 y.o. very pleasant male patient who presents with the following:  [name] is here today for a CPE.  He is fasting for labs.  He had some issues with proteinuria and edema last year- this eventually resolved.  He continued to note abdominal bloating but had a full work up including CT scan and colonoscopy that looked ok.  At this time he feels that his bloating is more or less resolved.   History of celiac disease followed by Dr. Deatra Ina.  He is careful to avoid gluten.   He feels that he may be having some allergy issues- he has noted some sx for about 3 weeks.  He has tried OTC nasal sprays and tablets; nothing has helped him much.  He does feel that he is doing better at this point.  He is using advair daily.  He is still needs albuterol 1-2x a day on more than 50% of days.   He had some swelling about 2 weeks ago in his legs.  He did not use the lasix this time- was not sure if he should do so or not.   Urinary frequncy is normal.  However his flow does not seem as good as usual.    He does see Dr. Otilio Connors at will see him in February.  We can check his PSA today for him to take to this appt  Wt Readings from Last 3 Encounters:  12/05/14 156 lb 3.2 oz (70.852 kg)  09/16/14 148 lb (67.132 kg)  08/16/14 147 lb (66.679 kg)     Patient Active Problem List   Diagnosis Date Noted  . Bloating 09/16/2014  . Proteinuria 05/30/2014  . Eosinophilia 02/01/2013  . Elevated PSA 02/01/2013  . HTN (hypertension) 07/27/2012  . Insomnia 07/27/2012  . Hypothyroid 07/27/2012  . ANEMIA, IRON DEFICIENCY 04/18/2008  . CELIAC SPRUE 04/18/2008  . DUODENITIS, WITHOUT HEMORRHAGE 06/02/2007    Past Medical History  Diagnosis  Date  . Hypertension   . Thyroid disease   . Insomnia   . PSA elevation   . Celiac disease   . Allergy   . Asthma   . Hyperlipidemia   . Chronic kidney disease     nephrotic syndrome    Past Surgical History  Procedure Laterality Date  . Eye surgery      cataract- both eyes  . Colonoscopy    . Upper gastrointestinal endoscopy      History  Substance Use Topics  . Smoking status: Never Smoker   . Smokeless tobacco: Never Used  . Alcohol Use: No     Comment: RARELY BEER    Family History  Problem Relation Age of Onset  . Alcohol abuse Mother   . COPD Father   . Diabetes Son   . Colon cancer Neg Hx   . Esophageal cancer Neg Hx   . Rectal cancer Neg Hx   . Stomach cancer Neg Hx     No Known Allergies  Medication list has been reviewed and updated.  Current Outpatient Prescriptions on File Prior to Visit  Medication Sig Dispense Refill  . albuterol (PROVENTIL HFA;VENTOLIN HFA) 108 (90 BASE) MCG/ACT inhaler Inhale  2 puffs into the lungs every 6 (six) hours as needed for wheezing or shortness of breath. 1 Inhaler 6  . aspirin 81 MG tablet Take 81 mg by mouth daily.    Marland Kitchen atorvastatin (LIPITOR) 10 MG tablet Take 1 tablet (10 mg total) by mouth daily. 90 tablet 3  . Cholecalciferol (D3 ADULT PO) Take 2,000 Int'l Units by mouth daily.    . Fluticasone-Salmeterol (ADVAIR) 100-50 MCG/DOSE AEPB Inhale 1 puff into the lungs 2 (two) times daily. 1 each 3  . levothyroxine (SYNTHROID, LEVOTHROID) 100 MCG tablet TAKE 1 TABLET EVERY DAY 90 tablet 3  . lisinopril (PRINIVIL,ZESTRIL) 5 MG tablet TAKE 1 TABLET BY MOUTH EVERY DAY FOR HIGH BLOOD PRESSURE 90 tablet 3  . Multiple Vitamin (MULTI VITAMIN DAILY PO) Take by mouth.    . zolpidem (AMBIEN) 10 MG tablet TAKE 1/2 tablet to maximum one TABLET AT BEDTIME AS NEEDED FOR SLEEP 90 tablet 1   No current facility-administered medications on file prior to visit.    Review of Systems:  As per HPI- otherwise negative.   Physical  Examination: Filed Vitals:   12/05/14 0833  BP: 126/90  Pulse: 119  Temp: 97.5 F (36.4 C)  Resp: 16   Filed Vitals:   12/05/14 0833  Height: 5' 5.25" (1.657 m)  Weight: 156 lb 3.2 oz (70.852 kg)   Body mass index is 25.81 kg/(m^2). Ideal Body Weight: Weight in (lb) to have BMI = 25: 151.1  GEN: WDWN, NAD, Non-toxic, A & O x 3, looks well HEENT: Atraumatic, Normocephalic. Neck supple. No masses, No LAD.  Bilateral TM wnl, oropharynx normal.  PEERL,EOMI.   Ears and Nose: No external deformity. CV: RRR, No M/G/R. No JVD. No thrill. No extra heart sounds. PULM: CTA B, no wheezes, crackles, rhonchi. No retractions. No resp. distress. No accessory muscle use. ABD: S, NT, ND, +BS. No rebound. No HSM. EXTR: No c/c/e NEURO Normal gait.  PSYCH: Normally interactive. Conversant. Not depressed or anxious appearing.  Calm demeanor.   Assessment and Plan: Mixed hyperlipidemia - Plan: atorvastatin (LIPITOR) 10 MG tablet, Lipid panel, Flu Vaccine QUAD 36+ mos IM, Pneumococcal conjugate vaccine 13-valent IM  Blood in stool  Other specified hypothyroidism - Plan: levothyroxine (SYNTHROID, LEVOTHROID) 100 MCG tablet, TSH  Immunization due  Asthma, chronic, mild persistent, uncomplicated - Plan: montelukast (SINGULAIR) 10 MG tablet  History of proteinuria syndrome - Plan: POCT urinalysis dipstick, CBC, Basic metabolic panel  Elevated PSA - Plan: PSA  Physical exam  Here today for a CPE.  Await labs and will follow-up with him.  Flu shot and prevnar today.   Will try adding singulair to his asthma/ allergy regimen.  Reminded him of goal to decrease albuterol use to a few times a month  Noted tachycardia. He recently used his albuterol.  Denies any SOB or CP- "I feel great."    Signed Lamar Blinks, MD 12/17: Addnd: his labs had come back with some surprising findings- his cholesterol went up 100 points, TSH went up and his creatinine bumped. He is not sure why- no diet changes or  changes in his lifestyle.  I asked lab to repeat his cholesterol and TSH- same findings.   We decided to increase his synthroid to 125 mcg, and he will double up on his atorvastatin.  Plan to recheck TSH and BMP in about one month will do UA then as well as this did not get run at his visit.   Also advised him that his  PSA has gone up.  He will see his urologist in February and will be sure to bring along a copy of his lab.    Results for orders placed or performed in visit on 12/05/14  Lipid panel  Result Value Ref Range   Cholesterol 330 (H) 0 - 200 mg/dL   Triglycerides 202 (H) <150 mg/dL   HDL 66 >39 mg/dL   Total CHOL/HDL Ratio 5.0 Ratio   VLDL 40 0 - 40 mg/dL   LDL Cholesterol 224 (H) 0 - 99 mg/dL  TSH  Result Value Ref Range   TSH 9.596 (H) 0.350 - 4.500 uIU/mL  PSA  Result Value Ref Range   PSA 7.83 (H) <=4.00 ng/mL  CBC  Result Value Ref Range   WBC 10.7 (H) 4.0 - 10.5 K/uL   RBC 5.34 4.22 - 5.81 MIL/uL   Hemoglobin 16.1 13.0 - 17.0 g/dL   HCT 47.9 39.0 - 52.0 %   MCV 89.7 78.0 - 100.0 fL   MCH 30.1 26.0 - 34.0 pg   MCHC 33.6 30.0 - 36.0 g/dL   RDW 13.8 11.5 - 15.5 %   Platelets 352 150 - 400 K/uL   MPV 9.4 9.4 - 12.4 fL  Basic metabolic panel  Result Value Ref Range   Sodium 137 135 - 145 mEq/L   Potassium 5.1 3.5 - 5.3 mEq/L   Chloride 103 96 - 112 mEq/L   CO2 24 19 - 32 mEq/L   Glucose, Bld 89 70 - 99 mg/dL   BUN 30 (H) 6 - 23 mg/dL   Creat 1.64 (H) 0.50 - 1.35 mg/dL   Calcium 8.4 8.4 - 10.5 mg/dL

## 2014-12-05 NOTE — Patient Instructions (Signed)
Always great to see you- take care and have a happy holiday season!  I will be in touch with your labs asap If you notice bloating it is ok to use the furosemide as needed- if you end up needing it more than a few days per month let me know I will look at your paper chart and give you an update regarding your pneumovax (pneumoia) and tetanus shots.  You got the prevnar pneumonia shot today; if not done already you will need a pneumovax next year.   Try the singulair for asthma and allergy symptoms.  I would like to get to where you only need to use albuterol a few times a month.    We are checking your PSA today- take this with you when you see your urologist in the spring.

## 2014-12-06 ENCOUNTER — Telehealth: Payer: Self-pay | Admitting: Family Medicine

## 2014-12-06 LAB — PSA: PSA: 7.83 ng/mL — AB (ref <=4.00)

## 2014-12-06 NOTE — Telephone Encounter (Signed)
Patient received flu shot on 12/7 at [facility] by Dr. Lorelei Pont

## 2014-12-07 ENCOUNTER — Other Ambulatory Visit: Payer: Self-pay | Admitting: Internal Medicine

## 2014-12-08 ENCOUNTER — Encounter: Payer: Self-pay | Admitting: Family Medicine

## 2014-12-13 IMAGING — CT CT ABD-PELV W/ CM
2 of 5 series · 16 of 46 positions shown, 18 images · IV contrast (Omni 300)
Comparison: Abdominal series 04/16/2014.

CLINICAL DATA: Abdominal distension.

EXAM:
CT ABDOMEN AND PELVIS WITH CONTRAST
TECHNIQUE: Multidetector CT imaging of the abdomen and pelvis was performed
using the standard protocol following bolus administration of
intravenous contrast.
CONTRAST:  100mL OMNIPAQUE IOHEXOL 300 MG/ML  SOLN

[Series 2: abd/ pelvis 5.0 i30f 1 · axial · 0.78mm/px · z∈[+456,+831]mm · 13 of 85 slices shown, 15 images]
[im 5/85  soft-tissue]
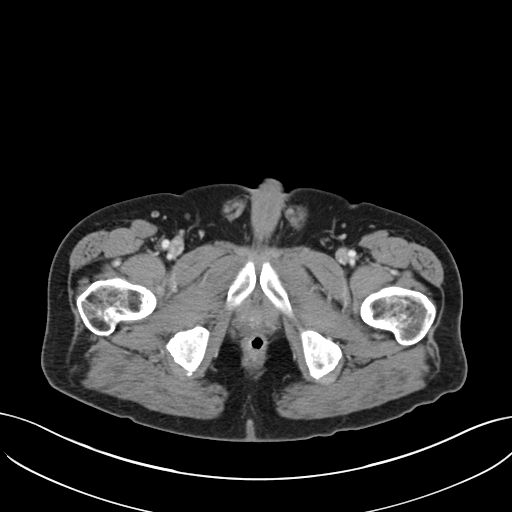
[im 5/85  bone]
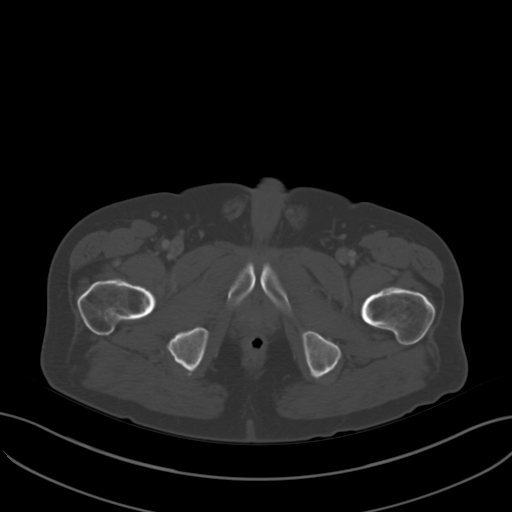
[im 10/85  soft-tissue]
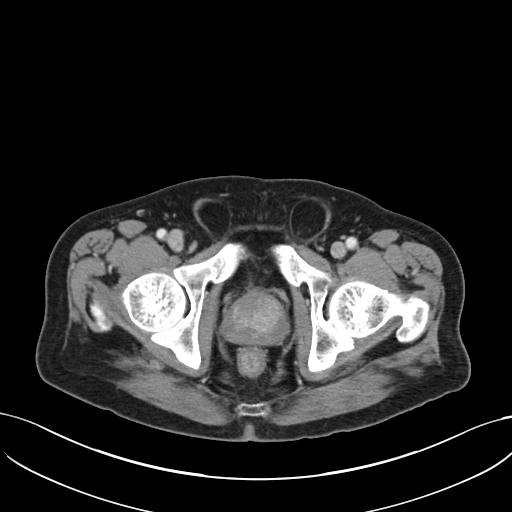
[im 19/85  soft-tissue]
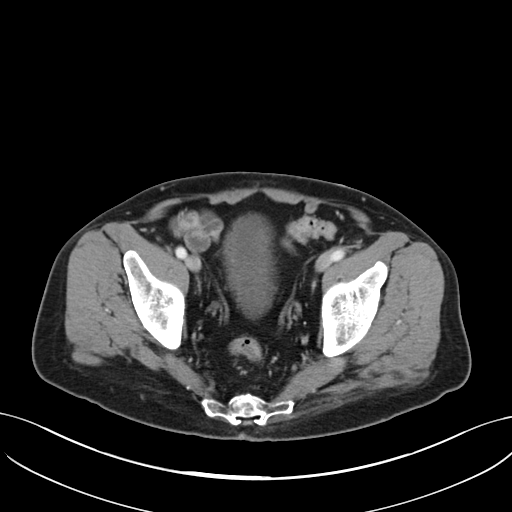
[im 24/85  soft-tissue]
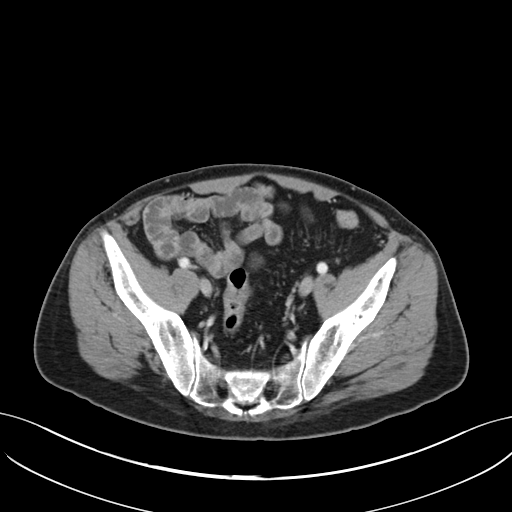
[im 29/85  soft-tissue]
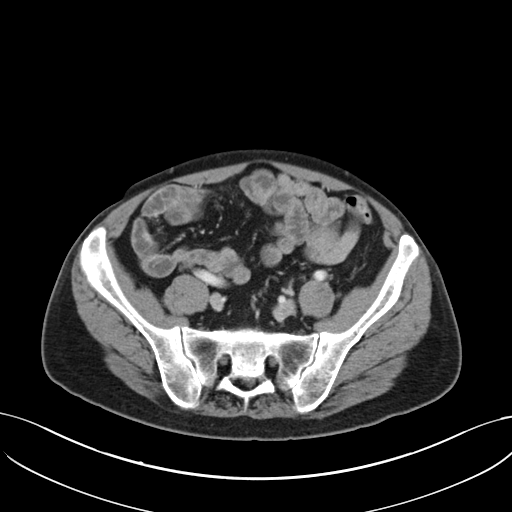
[im 38/85  soft-tissue]
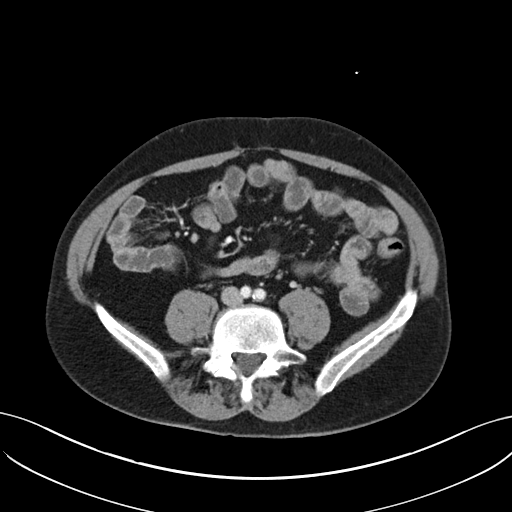
[im 43/85  soft-tissue]
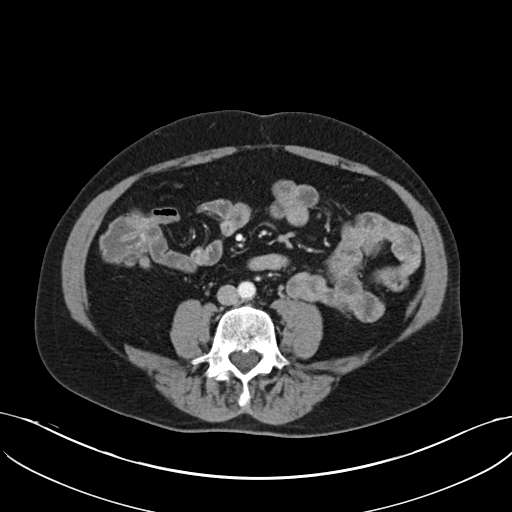
[im 47/85  soft-tissue]
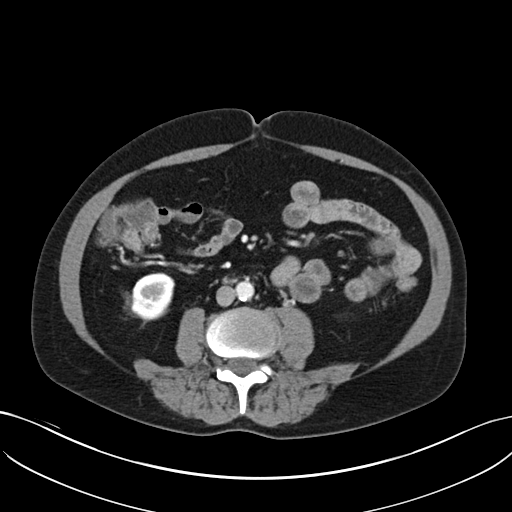
[im 57/85  soft-tissue]
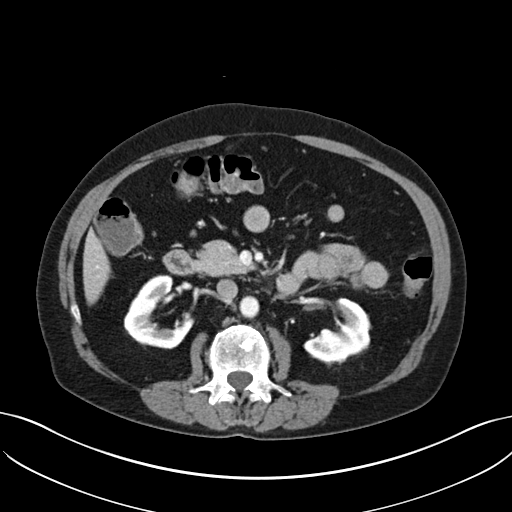
[im 57/85  bone]
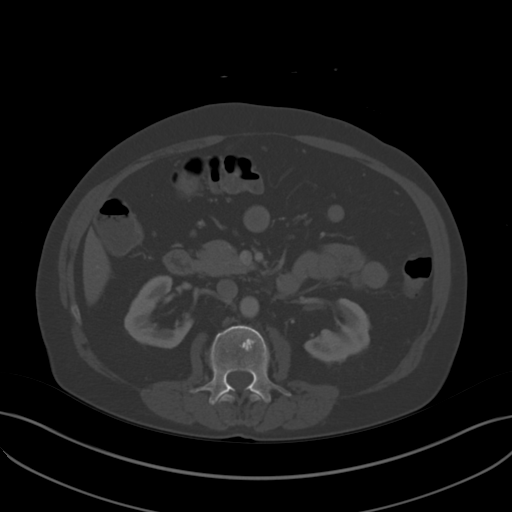
[im 61/85  soft-tissue]
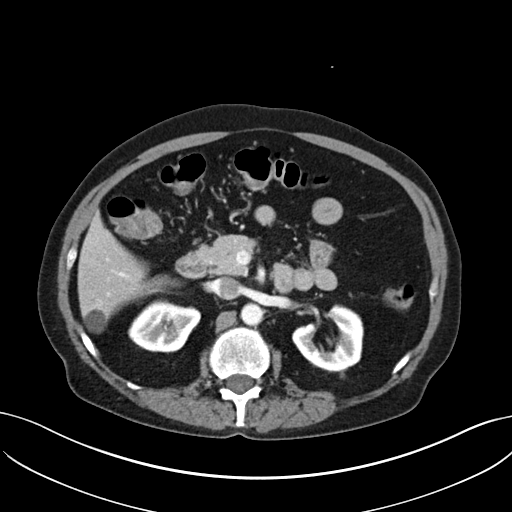
[im 66/85  soft-tissue]
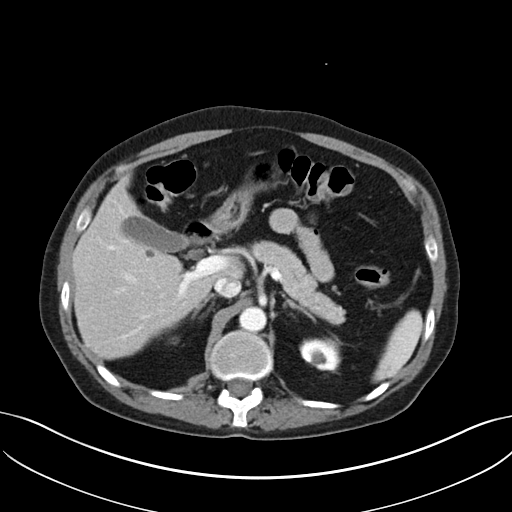
[im 75/85  soft-tissue]
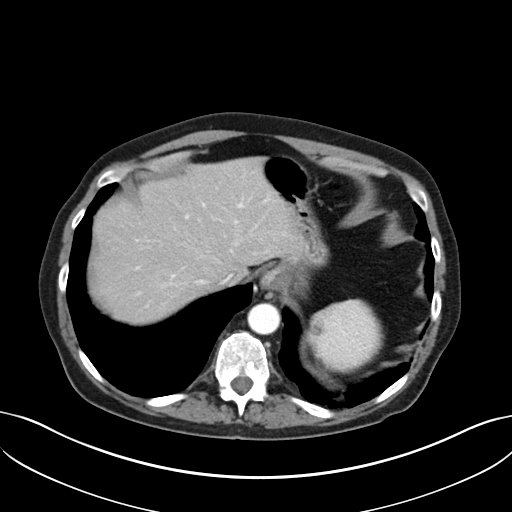
[im 80/85  soft-tissue]
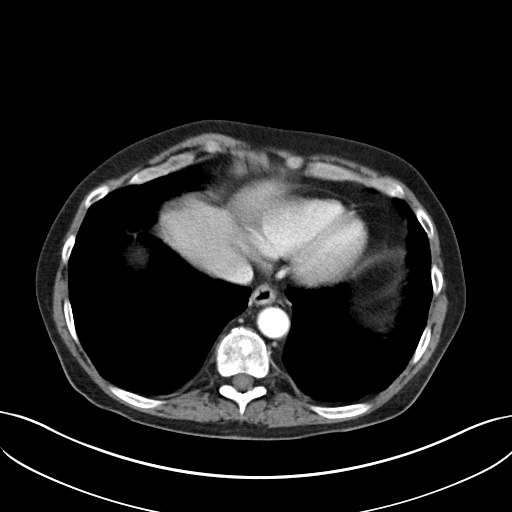

[Series 5: coronals · coronal · 0.69mm/px · 3 of 117 slices shown]
[im 39/117  soft-tissue]
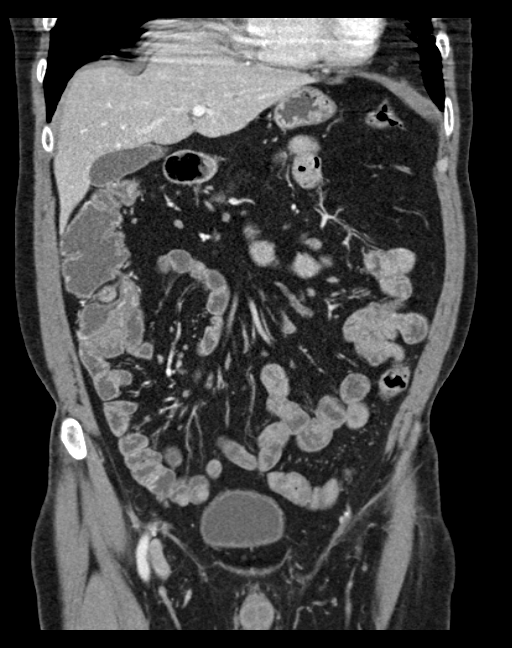
[im 52/117  soft-tissue]
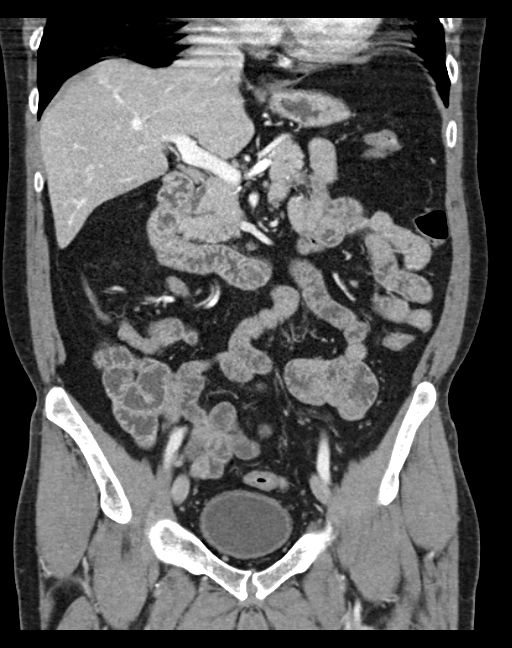
[im 65/117  soft-tissue]
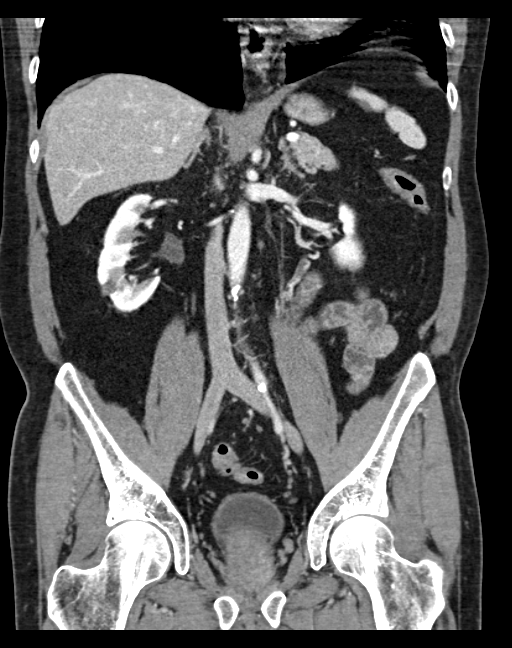

[16 of 46 positions shown; findings below may reference images not displayed]

FINDINGS: Multiple hepatic lucencies are noted consistent with simple cysts.
Small splenic lucencies are noted most consistent with simple cysts
or small vascular lesions. Pancreas is unremarkable. No biliary
distention. Gallbladder is nondistended.

Adrenals are normal. Simple renal cysts. No hydronephrosis or
obstructing ureteral stone. The bladder is nondistended. Prostate is
prominent.

No significant adenopathy. Abdominal aorta normal in caliber.
Visceral vessels are patent.

Appendix not definitely identified. No bowel distention. No free
air. No mesenteric mass. Bilateral large inguinal hernias with
herniation of fat only. Tiny umbilical hernia with herniation of
fat.

Heart size normal. Coronary artery disease. Mild basilar
atelectasis. Degenerative changes lumbar spine. No acute bony
abnormality.
IMPRESSION: 1. Mild prostate prominence.
2. Coronary artery disease.
3. Bilateral prominent inguinal hernias with herniation of fat only.

## 2014-12-15 MED ORDER — LEVOTHYROXINE SODIUM 125 MCG PO TABS
ORAL_TABLET | ORAL | Status: DC
Start: 2014-12-15 — End: 2015-06-05

## 2014-12-15 NOTE — Addendum Note (Signed)
Addended by: Lamar Blinks C on: 12/15/2014 04:43 PM   Modules accepted: Orders

## 2014-12-28 ENCOUNTER — Other Ambulatory Visit: Payer: Self-pay | Admitting: Family Medicine

## 2014-12-29 ENCOUNTER — Encounter: Payer: Self-pay | Admitting: Family Medicine

## 2015-01-28 ENCOUNTER — Encounter: Payer: Self-pay | Admitting: Family Medicine

## 2015-01-30 ENCOUNTER — Other Ambulatory Visit (INDEPENDENT_AMBULATORY_CARE_PROVIDER_SITE_OTHER): Payer: Medicare Other | Admitting: Family Medicine

## 2015-01-30 DIAGNOSIS — E038 Other specified hypothyroidism: Secondary | ICD-10-CM

## 2015-01-30 DIAGNOSIS — Z87448 Personal history of other diseases of urinary system: Secondary | ICD-10-CM

## 2015-01-30 LAB — POCT URINALYSIS DIPSTICK
BILIRUBIN UA: NEGATIVE
Blood, UA: NEGATIVE
GLUCOSE UA: NEGATIVE
Ketones, UA: NEGATIVE
LEUKOCYTES UA: NEGATIVE
Nitrite, UA: NEGATIVE
PH UA: 5
Protein, UA: NEGATIVE
UROBILINOGEN UA: 0.2

## 2015-01-30 LAB — BASIC METABOLIC PANEL
BUN: 29 mg/dL — ABNORMAL HIGH (ref 6–23)
CO2: 22 meq/L (ref 19–32)
Calcium: 10 mg/dL (ref 8.4–10.5)
Chloride: 103 mEq/L (ref 96–112)
Creat: 1.29 mg/dL (ref 0.50–1.35)
Glucose, Bld: 100 mg/dL — ABNORMAL HIGH (ref 70–99)
POTASSIUM: 5.3 meq/L (ref 3.5–5.3)
Sodium: 140 mEq/L (ref 135–145)

## 2015-01-30 LAB — POCT UA - MICROSCOPIC ONLY
CASTS, UR, LPF, POC: NEGATIVE
Crystals, Ur, HPF, POC: NEGATIVE
Epithelial cells, urine per micros: NEGATIVE
Mucus, UA: NEGATIVE
RBC, URINE, MICROSCOPIC: NEGATIVE
YEAST UA: NEGATIVE

## 2015-01-30 LAB — TSH: TSH: 0.113 u[IU]/mL — ABNORMAL LOW (ref 0.350–4.500)

## 2015-02-01 ENCOUNTER — Encounter: Payer: Self-pay | Admitting: Family Medicine

## 2015-02-01 DIAGNOSIS — E782 Mixed hyperlipidemia: Secondary | ICD-10-CM

## 2015-02-01 DIAGNOSIS — E038 Other specified hypothyroidism: Secondary | ICD-10-CM

## 2015-02-01 MED ORDER — ATORVASTATIN CALCIUM 20 MG PO TABS: 20.0000 mg | ORAL_TABLET | Freq: Every day | ORAL | Status: DC

## 2015-02-01 NOTE — Telephone Encounter (Signed)
Called and discussed with Shawn Santos.  He is currently on 125 mcg of his synthroid and he will change to 149mcg that he has at home, plan to recheck TSH in 2 months. I will place order.  Also he picked up another rx of lipitor- he was early because he had been doubling up on the 10mg  to make 20 mg as per my instructions.  This caused him to have to pay the cash price because he was too early for insurance to cover.  Discussed with pharmD- I changed him to 20 mg.  He will call Shawn Santos and see if he can bring back the unopened medication and exchange for the 20mg  tabletes  Meds ordered this encounter  Medications  . atorvastatin (LIPITOR) 20 MG tablet    Sig: Take 1 tablet (20 mg total) by mouth daily.    Dispense:  90 tablet    Refill:  3

## 2015-02-15 ENCOUNTER — Other Ambulatory Visit: Payer: Self-pay | Admitting: Internal Medicine

## 2015-02-19 ENCOUNTER — Encounter: Payer: Self-pay | Admitting: Family Medicine

## 2015-04-08 ENCOUNTER — Other Ambulatory Visit: Payer: Self-pay | Admitting: Family Medicine

## 2015-04-08 ENCOUNTER — Other Ambulatory Visit: Payer: Self-pay | Admitting: Physician Assistant

## 2015-06-05 ENCOUNTER — Other Ambulatory Visit: Payer: Self-pay | Admitting: Family Medicine

## 2015-06-12 ENCOUNTER — Ambulatory Visit (INDEPENDENT_AMBULATORY_CARE_PROVIDER_SITE_OTHER): Payer: Medicare Other | Admitting: Family Medicine

## 2015-06-12 ENCOUNTER — Encounter: Payer: Self-pay | Admitting: Family Medicine

## 2015-06-12 VITALS — BP 124/90 | HR 92 | Temp 97.4°F | Resp 16 | Ht 65.5 in | Wt 148.6 lb

## 2015-06-12 DIAGNOSIS — K900 Celiac disease: Secondary | ICD-10-CM

## 2015-06-12 DIAGNOSIS — G4700 Insomnia, unspecified: Secondary | ICD-10-CM | POA: Diagnosis not present

## 2015-06-12 DIAGNOSIS — J4531 Mild persistent asthma with (acute) exacerbation: Secondary | ICD-10-CM | POA: Diagnosis not present

## 2015-06-12 DIAGNOSIS — Z0000 Encounter for general adult medical examination without abnormal findings: Secondary | ICD-10-CM | POA: Diagnosis not present

## 2015-06-12 DIAGNOSIS — Z23 Encounter for immunization: Secondary | ICD-10-CM

## 2015-06-12 DIAGNOSIS — E039 Hypothyroidism, unspecified: Secondary | ICD-10-CM | POA: Diagnosis not present

## 2015-06-12 DIAGNOSIS — E785 Hyperlipidemia, unspecified: Secondary | ICD-10-CM | POA: Diagnosis not present

## 2015-06-12 LAB — TSH: TSH: 0.067 u[IU]/mL — ABNORMAL LOW (ref 0.350–4.500)

## 2015-06-12 LAB — COMPREHENSIVE METABOLIC PANEL
ALBUMIN: 4.4 g/dL (ref 3.5–5.2)
ALK PHOS: 83 U/L (ref 39–117)
ALT: 24 U/L (ref 0–53)
AST: 23 U/L (ref 0–37)
BUN: 13 mg/dL (ref 6–23)
CO2: 22 meq/L (ref 19–32)
Calcium: 9.2 mg/dL (ref 8.4–10.5)
Chloride: 107 mEq/L (ref 96–112)
Creat: 1.08 mg/dL (ref 0.50–1.35)
GLUCOSE: 94 mg/dL (ref 70–99)
Potassium: 4.3 mEq/L (ref 3.5–5.3)
SODIUM: 143 meq/L (ref 135–145)
TOTAL PROTEIN: 7.1 g/dL (ref 6.0–8.3)
Total Bilirubin: 0.5 mg/dL (ref 0.2–1.2)

## 2015-06-12 LAB — LIPID PANEL
Cholesterol: 139 mg/dL (ref 0–200)
HDL: 39 mg/dL — ABNORMAL LOW (ref >=40)
LDL Cholesterol: 85 mg/dL (ref 0–99)
Total CHOL/HDL Ratio: 3.6 Ratio
Triglycerides: 73 mg/dL (ref <150)
VLDL: 15 mg/dL (ref 0–40)

## 2015-06-12 LAB — CBC
HEMATOCRIT: 44.4 % (ref 39.0–52.0)
Hemoglobin: 15.1 g/dL (ref 13.0–17.0)
MCH: 30.7 pg (ref 26.0–34.0)
MCHC: 34 g/dL (ref 30.0–36.0)
MCV: 90.2 fL (ref 78.0–100.0)
MPV: 10.2 fL (ref 8.6–12.4)
PLATELETS: 288 10*3/uL (ref 150–400)
RBC: 4.92 MIL/uL (ref 4.22–5.81)
RDW: 13.7 % (ref 11.5–15.5)
WBC: 9.5 10*3/uL (ref 4.0–10.5)

## 2015-06-12 MED ORDER — TRAZODONE HCL 50 MG PO TABS: 25.0000 mg | ORAL_TABLET | Freq: Every evening | ORAL | Status: DC | PRN

## 2015-06-12 NOTE — Progress Notes (Addendum)
[facility] [address], [place] [address] [phone]  Date:  06/12/2015   Name:  Shawn Santos   DOB:  06-02-1946   MRN:  [id number]  PCP:  Shawn Blinks, MD    Chief Complaint: Annual Exam   History of Present Illness:  Shawn Santos is a 69 y.o. very pleasant male patient who presents with the following:  Here today for a recheck and CPE.  He had proteinuria and swelling last year.  He is seeing Dr. Vernie Santos every year for his elevated PSA; this has gone down. He did not need another prostate bx at this time which is a relief to him.   Colonoscopy done last year; given a 10 year follow-up by Dr. Deatra Santos.    He has issues with imsomnia.  He goes to bed ok, but wakes up early around 0430.  He has been on [place] for 1-2 years, prior to this he was using alprazolam.  He feels that the [place] helps him more.  He does take 5mg  every evening.  He wakes up once a night, often to go to the bathroom; he cannot get back to sleep.  This has him going to bed around 11 and often rising at 29M.  He also has difficutly falling asleep; "I couldn't do it without medication."  He will feel tired but never somnolent during the day.    His left shoulder problem is resolved He continues to have some issues with asthma.  He has been using albuterol perhaps once a day.  He is also on singulaIR and adviar.  He uses the advair in the morning only.    He uses nexium and takes it every 1-2 days.  He will notice heartburn if he tries to go too long wihtout it.    He is fasting this am for labs  Patient Active Problem List   Diagnosis Date Noted  . Mixed hyperlipidemia 12/05/2014  . Bloating 09/16/2014  . Proteinuria 05/30/2014  . Eosinophilia 02/01/2013  . Elevated PSA 02/01/2013  . HTN (hypertension) 07/27/2012  . Insomnia 07/27/2012  . Hypothyroid 07/27/2012  . ANEMIA, IRON DEFICIENCY 04/18/2008  . CELIAC SPRUE 04/18/2008  . DUODENITIS, WITHOUT HEMORRHAGE 06/02/2007     Past Medical History  Diagnosis Date  . Hypertension   . Thyroid disease   . Insomnia   . PSA elevation   . Celiac disease   . Allergy   . Asthma   . Hyperlipidemia   . Chronic kidney disease     nephrotic syndrome    Past Surgical History  Procedure Laterality Date  . Eye surgery      cataract- both eyes  . Colonoscopy    . Upper gastrointestinal endoscopy      History  Substance Use Topics  . Smoking status: Never Smoker   . Smokeless tobacco: Never Used  . Alcohol Use: No     Comment: RARELY BEER    Family History  Problem Relation Age of Onset  . Alcohol abuse Mother   . COPD Father   . Diabetes Son   . Colon cancer Neg Hx   . Esophageal cancer Neg Hx   . Rectal cancer Neg Hx   . Stomach cancer Neg Hx     No Known Allergies  Medication list has been reviewed and updated.  Current Outpatient Prescriptions on File Prior to Visit  Medication Sig Dispense Refill  . albuterol (VENTOLIN HFA) 108 (90 BASE) MCG/ACT inhaler INHALE  2 PUFFS INTO THE LUNGS EVERY 6 HOURS AS NEEDED FOR WHEEZING OR SHORTNESS OF BREATH. "OV NEEDED FOR ADDITIONAL REFILLS" 18 Inhaler 0  . aspirin 81 MG tablet Take 81 mg by mouth daily.    Marland Kitchen atorvastatin (LIPITOR) 20 MG tablet Take 1 tablet (20 mg total) by mouth daily. 90 tablet 3  . Cholecalciferol (D3 ADULT PO) Take 2,000 Int'l Units by mouth daily.    . Fluticasone-Salmeterol (ADVAIR DISKUS) 100-50 MCG/DOSE AEPB INHALE 1 PUFF INTO THE LUNGS 2 (TWO) TIMES DAILY. 60 each 1  . levothyroxine (SYNTHROID, LEVOTHROID) 125 MCG tablet TAKE 1 TABLET EVERY DAY 30 tablet 0  . lisinopril (PRINIVIL,ZESTRIL) 5 MG tablet TAKE 1 TABLET BY MOUTH EVERY DAY FOR HIGH BLOOD PRESSURE 90 tablet 3  . montelukast (SINGULAIR) 10 MG tablet Take 1 tablet (10 mg total) by mouth at bedtime. 90 tablet 3  . Multiple Vitamin (MULTI VITAMIN DAILY PO) Take by mouth.    . zolpidem (AMBIEN) 5 MG tablet Take 5 mg by mouth at bedtime as needed for sleep.     No current  facility-administered medications on file prior to visit.    Review of Systems:  As per HPI- otherwise negative.   Physical Examination: Filed Vitals:   06/12/15 0839  BP: 124/90  Pulse: 92  Temp: 97.4 F (36.3 C)  Resp: 16   Filed Vitals:   06/12/15 0839  Height: 5' 5.5" (1.664 m)  Weight: 148 lb 9.6 oz (67.405 kg)   Body mass index is 24.34 kg/(m^2). Ideal Body Weight: Weight in (lb) to have BMI = 25: 152.2  GEN: WDWN, NAD, Non-toxic, A & O x 3, looks well HEENT: Atraumatic, Normocephalic. Neck supple. No masses, No LAD.  Bilateral TM wnl, oropharynx normal.  PEERL,EOMI.   Ears and Nose: No external deformity. CV: RRR, No M/G/R. No JVD. No thrill. No extra heart sounds. PULM: CTA B, no wheezes, crackles, rhonchi. No retractions. No resp. distress. No accessory muscle use. ABD: S, NT, ND, +BS. No rebound. No HSM. EXTR: No c/c/e NEURO Normal gait.  PSYCH: Normally interactive. Conversant. Not depressed or anxious appearing.  Calm demeanor.    Assessment and Plan: Physical exam  Immunization due - Plan: Tdap vaccine greater than or equal to 7yo IM  Insomnia - Plan: traZODone (DESYREL) 50 MG tablet  Hyperlipidemia - Plan: Comprehensive metabolic panel, Lipid panel  Hypothyroidism, unspecified hypothyroidism type - Plan: TSH  Asthma with acute exacerbation, mild persistent  Celiac disease - Plan: CBC  Physical exam today His celiac disease is monitored by GI, recent negative colonoscopy His asthma sx persist- however, he is using his adviar just once a day.  Counseled him to increase to BID and we will see if this helps  Persistent insomnia.  He is older, do not want to go to 10 mg of ambien if we can help it.  He is willing to try another drug- we will try trazodone at 25 mg, increase to 50 if needed  Follow-up on thyroid.  I had asked him to change from 125 to 100 mcg after last TSH check in February but suspect he did not see this message.  In any case will  check TSH again today and assess.   Results for orders placed or performed in visit on 06/12/15  CBC  Result Value Ref Range   WBC 9.5 4.0 - 10.5 K/uL   RBC 4.92 4.22 - 5.81 MIL/uL   Hemoglobin 15.1 13.0 - 17.0 g/dL   HCT  44.4 39.0 - 52.0 %   MCV 90.2 78.0 - 100.0 fL   MCH 30.7 26.0 - 34.0 pg   MCHC 34.0 30.0 - 36.0 g/dL   RDW 13.7 11.5 - 15.5 %   Platelets 288 150 - 400 K/uL   MPV 10.2 8.6 - 12.4 fL  Comprehensive metabolic panel  Result Value Ref Range   Sodium 143 135 - 145 mEq/L   Potassium 4.3 3.5 - 5.3 mEq/L   Chloride 107 96 - 112 mEq/L   CO2 22 19 - 32 mEq/L   Glucose, Bld 94 70 - 99 mg/dL   BUN 13 6 - 23 mg/dL   Creat 1.08 0.50 - 1.35 mg/dL   Total Bilirubin 0.5 0.2 - 1.2 mg/dL   Alkaline Phosphatase 83 39 - 117 U/L   AST 23 0 - 37 U/L   ALT 24 0 - 53 U/L   Total Protein 7.1 6.0 - 8.3 g/dL   Albumin 4.4 3.5 - 5.2 g/dL   Calcium 9.2 8.4 - 10.5 mg/dL  TSH  Result Value Ref Range   TSH 0.067 (L) 0.350 - 4.500 uIU/mL  Lipid panel  Result Value Ref Range   Cholesterol 139 0 - 200 mg/dL   Triglycerides 73 <150 mg/dL   HDL 39 (L) >=40 mg/dL   Total CHOL/HDL Ratio 3.6 Ratio   VLDL 15 0 - 40 mg/dL   LDL Cholesterol 85 0 - 99 mg/dL   Will ask him to decrease his thyroid medication and follow-up closely  Signed Shawn Blinks, MD  Called to go over labs 6/14: he actually never did decrease his levothyroxine to 100 mcg- he thought that he did but he is still on 125.  Will back him down to 75mg  and he will plan to have a repeat TSH in 2-3 weeks- will order for him.  Otherwise his labs look good

## 2015-06-12 NOTE — Patient Instructions (Addendum)
Please use your adviar (purple disc inhaler) twice a day, regardless of any symptoms.  I hope that this will reduce your overall brething issues Let's try having you use trazodone for sleep.  Start with 25 mg at bedtime and go up to 50mg  as needed.    Please let me know how it goes with the trazodone.  If this does not work for sleep we can try the extended release ambien.    As you have been on ambein for some time, I would recommend that you take a 1/4 or 1/3 of a pill at night for 2-3 days to taper off prior to making the switch   I will be in touch with your labs  Good to see you today as always!

## 2015-06-13 MED ORDER — LEVOTHYROXINE SODIUM 75 MCG PO TABS: 75.0000 ug | ORAL_TABLET | Freq: Every day | ORAL | Status: DC

## 2015-06-13 NOTE — Addendum Note (Signed)
Addended by: Lamar Blinks C on: 06/13/2015 05:43 PM   Modules accepted: Orders, Medications

## 2015-06-16 ENCOUNTER — Encounter: Payer: Self-pay | Admitting: Family Medicine

## 2015-06-27 ENCOUNTER — Other Ambulatory Visit: Payer: Self-pay | Admitting: Physician Assistant

## 2015-07-03 ENCOUNTER — Encounter: Payer: Self-pay | Admitting: Family Medicine

## 2015-07-05 ENCOUNTER — Other Ambulatory Visit (INDEPENDENT_AMBULATORY_CARE_PROVIDER_SITE_OTHER): Payer: Medicare Other

## 2015-07-05 DIAGNOSIS — E038 Other specified hypothyroidism: Secondary | ICD-10-CM

## 2015-07-05 LAB — TSH: TSH: 1.244 u[IU]/mL (ref 0.350–4.500)

## 2015-07-05 MED ORDER — ZOLPIDEM TARTRATE 10 MG PO TABS
10.0000 mg | ORAL_TABLET | Freq: Every evening | ORAL | Status: DC | PRN
Start: 2015-07-05 — End: 2015-07-06

## 2015-07-05 NOTE — Progress Notes (Signed)
Pt is here for lab work only. 

## 2015-07-06 MED ORDER — ZOLPIDEM TARTRATE 5 MG PO TABS: 5.0000 mg | ORAL_TABLET | Freq: Every evening | ORAL | Status: DC | PRN

## 2015-07-06 NOTE — Addendum Note (Signed)
Addended by: Lamar Blinks C on: 07/06/2015 07:54 PM   Modules accepted: Orders, Medications

## 2015-07-17 ENCOUNTER — Other Ambulatory Visit: Payer: Self-pay | Admitting: Family Medicine

## 2015-07-19 ENCOUNTER — Encounter: Payer: Self-pay | Admitting: Gastroenterology

## 2015-07-22 ENCOUNTER — Other Ambulatory Visit: Payer: Self-pay | Admitting: Physician Assistant

## 2015-08-10 ENCOUNTER — Other Ambulatory Visit: Payer: Self-pay

## 2015-08-10 DIAGNOSIS — G4700 Insomnia, unspecified: Secondary | ICD-10-CM

## 2015-08-10 MED ORDER — TRAZODONE HCL 50 MG PO TABS: 25.0000 mg | ORAL_TABLET | Freq: Every evening | ORAL | Status: DC | PRN

## 2015-08-23 ENCOUNTER — Other Ambulatory Visit: Payer: Self-pay | Admitting: Family Medicine

## 2015-10-15 ENCOUNTER — Other Ambulatory Visit: Payer: Self-pay | Admitting: Physician Assistant

## 2015-10-19 ENCOUNTER — Other Ambulatory Visit: Payer: Self-pay | Admitting: Family Medicine

## 2015-10-24 ENCOUNTER — Encounter: Payer: Self-pay | Admitting: Family Medicine

## 2015-10-27 ENCOUNTER — Ambulatory Visit (INDEPENDENT_AMBULATORY_CARE_PROVIDER_SITE_OTHER): Payer: Medicare Other | Admitting: Family Medicine

## 2015-10-27 DIAGNOSIS — Z23 Encounter for immunization: Secondary | ICD-10-CM

## 2015-12-01 ENCOUNTER — Other Ambulatory Visit: Payer: Self-pay | Admitting: Family Medicine

## 2015-12-11 ENCOUNTER — Ambulatory Visit: Payer: Medicare Other | Admitting: Family Medicine

## 2015-12-13 ENCOUNTER — Ambulatory Visit (INDEPENDENT_AMBULATORY_CARE_PROVIDER_SITE_OTHER): Payer: Medicare Other | Admitting: Family Medicine

## 2015-12-13 ENCOUNTER — Encounter: Payer: Self-pay | Admitting: Family Medicine

## 2015-12-13 VITALS — BP 125/76 | HR 98 | Temp 97.6°F | Resp 16 | Ht 65.26 in | Wt 145.6 lb

## 2015-12-13 DIAGNOSIS — G4700 Insomnia, unspecified: Secondary | ICD-10-CM | POA: Diagnosis not present

## 2015-12-13 DIAGNOSIS — I10 Essential (primary) hypertension: Secondary | ICD-10-CM

## 2015-12-13 DIAGNOSIS — J4540 Moderate persistent asthma, uncomplicated: Secondary | ICD-10-CM | POA: Diagnosis not present

## 2015-12-13 MED ORDER — TRAZODONE HCL 50 MG PO TABS: 25.0000 mg | ORAL_TABLET | Freq: Every evening | ORAL | Status: DC | PRN

## 2015-12-13 MED ORDER — ALBUTEROL SULFATE HFA 108 (90 BASE) MCG/ACT IN AERS: 2.0000 | INHALATION_SPRAY | Freq: Four times a day (QID) | RESPIRATORY_TRACT | Status: DC | PRN

## 2015-12-13 MED ORDER — FLUTICASONE-SALMETEROL 250-50 MCG/DOSE IN AEPB: 1.0000 | INHALATION_SPRAY | Freq: Two times a day (BID) | RESPIRATORY_TRACT | Status: DC

## 2015-12-13 NOTE — Progress Notes (Signed)
[facility] [address], [place] [address] [phone]  Date:  12/13/2015   Name:  Shawn Santos   DOB:  [birth year]   MRN:  [id number]  PCP:  Lamar Blinks, MD    Chief Complaint: Follow-up   History of Present Illness:  Shawn Santos is a 69 y.o. very pleasant male patient who presents with the following:  Here today to follow-up his HTN. History of proteinuria, elevated PSA, hypothyroidism, insomnia.   Last labs in June- looked ok except for TSH which was ok on recheck in July.   He has not had any swelling and is overall feeling well.   He does have still incompletely controlled asthma- he is using proair 2-3x a day, some days are better. He is not aware of any triggers- no change with exercise or time of year/ weather. He is also on advair BID and singulair  Immunizations are all UTD  He has family plans for christmas- will be spending it in [place]  Patient Active Problem List   Diagnosis Date Noted  . Mixed hyperlipidemia 12/05/2014  . Bloating 09/16/2014  . Proteinuria 05/30/2014  . Eosinophilia 02/01/2013  . Elevated PSA 02/01/2013  . HTN (hypertension) 07/27/2012  . Insomnia 07/27/2012  . Hypothyroid 07/27/2012  . ANEMIA, IRON DEFICIENCY 04/18/2008  . CELIAC SPRUE 04/18/2008  . DUODENITIS, WITHOUT HEMORRHAGE 06/02/2007    Past Medical History  Diagnosis Date  . Hypertension   . Thyroid disease   . Insomnia   . PSA elevation   . Celiac disease   . Allergy   . Asthma   . Hyperlipidemia   . Chronic kidney disease     nephrotic syndrome    Past Surgical History  Procedure Laterality Date  . Eye surgery      cataract- both eyes  . Colonoscopy    . Upper gastrointestinal endoscopy      Social History  Substance Use Topics  . Smoking status: Never Smoker   . Smokeless tobacco: Never Used  . Alcohol Use: No     Comment: RARELY BEER    Family History  Problem Relation Age of Onset  . Alcohol abuse Mother   .  COPD Father   . Diabetes Son   . Colon cancer Neg Hx   . Esophageal cancer Neg Hx   . Rectal cancer Neg Hx   . Stomach cancer Neg Hx     No Known Allergies  Medication list has been reviewed and updated.  Current Outpatient Prescriptions on File Prior to Visit  Medication Sig Dispense Refill  . aspirin 81 MG tablet Take 81 mg by mouth daily.    Marland Kitchen atorvastatin (LIPITOR) 20 MG tablet Take 1 tablet (20 mg total) by mouth daily. 90 tablet 3  . Cholecalciferol (D3 ADULT PO) Take 2,000 Int'l Units by mouth daily.    . Fluticasone-Salmeterol (ADVAIR DISKUS) 100-50 MCG/DOSE AEPB INHALE 1 PUFF INTO THE LUNGS 2 (TWO) TIMES DAILY 60 each 1  . levothyroxine (SYNTHROID, LEVOTHROID) 75 MCG tablet Take 1 tablet (75 mcg total) by mouth daily before breakfast. PATIENT NEEDS OFFICE VISIT FOR ADDITIONAL REFILLS 30 tablet 0  . lisinopril (PRINIVIL,ZESTRIL) 5 MG tablet TAKE 1 TABLET BY MOUTH EVERY DAY FOR HIGH BLOOD PRESSURE 90 tablet 0  . montelukast (SINGULAIR) 10 MG tablet Take 1 tablet (10 mg total) by mouth at bedtime. 90 tablet 3  . PROAIR HFA 108 (90 BASE) MCG/ACT inhaler INHALE 2 PUFFS INTO THE LUNGS EVERY  6 HOURS AS NEEDED FOR SHORTNESS OF BREATH OR WHEEZING 8.5 Inhaler 3  . traZODone (DESYREL) 50 MG tablet Take 0.5-1 tablets (25-50 mg total) by mouth at bedtime as needed for sleep. 90 tablet 0  . Multiple Vitamin (MULTI VITAMIN DAILY PO) Take by mouth. Reported on 12/13/2015    . zolpidem (AMBIEN) 5 MG tablet Take 1 tablet (5 mg total) by mouth at bedtime as needed for sleep. (Patient not taking: Reported on 12/13/2015) 90 tablet 1   No current facility-administered medications on file prior to visit.    Review of Systems:  As per HPI- otherwise negative.   Physical Examination: Filed Vitals:   12/13/15 0805 12/13/15 0809  BP: 149/80 125/76  Pulse: 98   Temp: 97.6 F (36.4 C)   Resp: 16    Filed Vitals:   12/13/15 0805  Height: 5' 5.26" (1.658 m)  Weight: 145 lb 9.6 oz (66.044 kg)    Body mass index is 24.03 kg/(m^2). Ideal Body Weight: Weight in (lb) to have BMI = 25: 151.1  GEN: WDWN, NAD, Non-toxic, A & O x 3, looks well, recheck BP ok.  Normal weight HEENT: Atraumatic, Normocephalic. Neck supple. No masses, No LAD. Ears and Nose: No external deformity. CV: RRR, No M/G/R. No JVD. No thrill. No extra heart sounds. PULM: CTA B, no wheezes, crackles, rhonchi. No retractions. No resp. distress. No accessory muscle use. EXTR: No c/c/e NEURO Normal gait.  PSYCH: Normally interactive. Conversant. Not depressed or anxious appearing.  Calm demeanor.    Assessment and Plan: Asthma, moderate persistent, uncomplicated - Plan: Fluticasone-Salmeterol (ADVAIR DISKUS) 250-50 MCG/DOSE AEPB, albuterol (PROVENTIL HFA;VENTOLIN HFA) 108 (90 BASE) MCG/ACT inhaler  Insomnia - Plan: traZODone (DESYREL) 50 MG tablet  Essential hypertension  His asthma is incompletely controlled- will try increasing his adviar dose and he will see if this helps.  If it does NOT help can go back down to old dosage He is using trazodone with success and is off [place]!  BP looks good on current dosage Recheck 6 months   Signed Lamar Blinks, MD

## 2015-12-14 ENCOUNTER — Other Ambulatory Visit: Payer: Self-pay | Admitting: Family Medicine

## 2016-01-08 ENCOUNTER — Other Ambulatory Visit: Payer: Self-pay | Admitting: Family Medicine

## 2016-01-14 ENCOUNTER — Encounter: Payer: Self-pay | Admitting: Family Medicine

## 2016-01-14 DIAGNOSIS — Z9109 Other allergy status, other than to drugs and biological substances: Secondary | ICD-10-CM

## 2016-01-15 MED ORDER — FLUTICASONE PROPIONATE 50 MCG/ACT NA SUSP: 2.0000 | Freq: Every day | NASAL | Status: AC

## 2016-01-18 ENCOUNTER — Other Ambulatory Visit: Payer: Self-pay | Admitting: Family Medicine

## 2016-01-19 ENCOUNTER — Encounter: Payer: Self-pay | Admitting: Family Medicine

## 2016-01-24 ENCOUNTER — Encounter: Payer: Self-pay | Admitting: Family Medicine

## 2016-01-27 ENCOUNTER — Other Ambulatory Visit: Payer: Self-pay | Admitting: Family Medicine

## 2016-01-27 ENCOUNTER — Encounter: Payer: Self-pay | Admitting: Family Medicine

## 2016-01-28 NOTE — Telephone Encounter (Signed)
Can you please call and check the status on his referral to the allergist?

## 2016-02-05 ENCOUNTER — Encounter: Payer: Self-pay | Admitting: Family Medicine

## 2016-02-05 ENCOUNTER — Other Ambulatory Visit: Payer: Self-pay | Admitting: Family Medicine

## 2016-02-05 DIAGNOSIS — E034 Atrophy of thyroid (acquired): Secondary | ICD-10-CM

## 2016-02-05 NOTE — Telephone Encounter (Signed)
Dr. Lorelei Pont, his last TSH was 7/16 can we refill or RTC

## 2016-02-06 ENCOUNTER — Encounter: Payer: Self-pay | Admitting: Family Medicine

## 2016-02-10 ENCOUNTER — Other Ambulatory Visit (INDEPENDENT_AMBULATORY_CARE_PROVIDER_SITE_OTHER): Payer: Medicare Other

## 2016-02-10 DIAGNOSIS — E039 Hypothyroidism, unspecified: Secondary | ICD-10-CM

## 2016-02-10 NOTE — Progress Notes (Signed)
Patient here today for lab draw only. 

## 2016-02-11 LAB — TSH: TSH: 1.22 m[IU]/L (ref 0.40–4.50)

## 2016-02-14 ENCOUNTER — Encounter: Payer: Self-pay | Admitting: Family Medicine

## 2016-02-20 ENCOUNTER — Other Ambulatory Visit: Payer: Self-pay | Admitting: Family Medicine

## 2016-04-17 ENCOUNTER — Other Ambulatory Visit: Payer: Self-pay

## 2016-04-17 MED ORDER — LISINOPRIL 5 MG PO TABS: 5.0000 mg | ORAL_TABLET | Freq: Every day | ORAL | Status: DC

## 2016-05-02 ENCOUNTER — Other Ambulatory Visit: Payer: Self-pay | Admitting: Family Medicine

## 2016-05-02 ENCOUNTER — Encounter: Payer: Self-pay | Admitting: Family Medicine

## 2016-05-02 DIAGNOSIS — E785 Hyperlipidemia, unspecified: Secondary | ICD-10-CM

## 2016-05-02 DIAGNOSIS — E038 Other specified hypothyroidism: Secondary | ICD-10-CM

## 2016-05-06 MED ORDER — ATORVASTATIN CALCIUM 20 MG PO TABS: ORAL_TABLET | ORAL | Status: DC

## 2016-05-06 MED ORDER — LEVOTHYROXINE SODIUM 75 MCG PO TABS: ORAL_TABLET | ORAL | Status: DC

## 2016-06-10 ENCOUNTER — Other Ambulatory Visit: Payer: Self-pay | Admitting: Emergency Medicine

## 2016-06-10 MED ORDER — MONTELUKAST SODIUM 10 MG PO TABS: ORAL_TABLET | ORAL | Status: DC

## 2016-06-10 NOTE — Telephone Encounter (Signed)
Refill for Montelukast sent to pharmacy.

## 2016-06-19 ENCOUNTER — Ambulatory Visit (INDEPENDENT_AMBULATORY_CARE_PROVIDER_SITE_OTHER): Payer: Medicare Other | Admitting: Family Medicine

## 2016-06-19 ENCOUNTER — Encounter: Payer: Self-pay | Admitting: Family Medicine

## 2016-06-19 VITALS — BP 138/88 | HR 93 | Temp 98.1°F | Ht 65.0 in | Wt 161.8 lb

## 2016-06-19 DIAGNOSIS — Z119 Encounter for screening for infectious and parasitic diseases, unspecified: Secondary | ICD-10-CM

## 2016-06-19 DIAGNOSIS — Z130 Encounter for screening for diseases of the blood and blood-forming organs and certain disorders involving the immune mechanism: Secondary | ICD-10-CM

## 2016-06-19 DIAGNOSIS — D229 Melanocytic nevi, unspecified: Secondary | ICD-10-CM

## 2016-06-19 DIAGNOSIS — E785 Hyperlipidemia, unspecified: Secondary | ICD-10-CM

## 2016-06-19 DIAGNOSIS — D72829 Elevated white blood cell count, unspecified: Secondary | ICD-10-CM

## 2016-06-19 DIAGNOSIS — E038 Other specified hypothyroidism: Secondary | ICD-10-CM | POA: Diagnosis not present

## 2016-06-19 DIAGNOSIS — E034 Atrophy of thyroid (acquired): Secondary | ICD-10-CM

## 2016-06-19 DIAGNOSIS — Z5181 Encounter for therapeutic drug level monitoring: Secondary | ICD-10-CM

## 2016-06-19 DIAGNOSIS — Z131 Encounter for screening for diabetes mellitus: Secondary | ICD-10-CM

## 2016-06-19 DIAGNOSIS — Z0000 Encounter for general adult medical examination without abnormal findings: Secondary | ICD-10-CM

## 2016-06-19 LAB — COMPREHENSIVE METABOLIC PANEL
ALT: 23 U/L (ref 0–53)
AST: 25 U/L (ref 0–37)
Albumin: 4.3 g/dL (ref 3.5–5.2)
Alkaline Phosphatase: 64 U/L (ref 39–117)
BUN: 20 mg/dL (ref 6–23)
CALCIUM: 9.2 mg/dL (ref 8.4–10.5)
CHLORIDE: 106 meq/L (ref 96–112)
CO2: 21 meq/L (ref 19–32)
CREATININE: 1.22 mg/dL (ref 0.40–1.50)
GFR: 62.12 mL/min (ref >60.00)
Glucose, Bld: 93 mg/dL (ref 70–99)
Potassium: 4.5 mEq/L (ref 3.5–5.1)
SODIUM: 139 meq/L (ref 135–145)
Total Bilirubin: 0.4 mg/dL (ref 0.2–1.2)
Total Protein: 6.8 g/dL (ref 6.0–8.3)

## 2016-06-19 LAB — CBC
HEMATOCRIT: 44.1 % (ref 39.0–52.0)
HEMOGLOBIN: 14.6 g/dL (ref 13.0–17.0)
MCHC: 33.1 g/dL (ref 30.0–36.0)
MCV: 92.4 fl (ref 78.0–100.0)
Platelets: 251 10*3/uL (ref 150.0–400.0)
RBC: 4.77 Mil/uL (ref 4.22–5.81)
RDW: 14.1 % (ref 11.5–15.5)
WBC: 13.5 10*3/uL — ABNORMAL HIGH (ref 4.0–10.5)

## 2016-06-19 LAB — LIPID PANEL
CHOLESTEROL: 141 mg/dL (ref 0–200)
HDL: 48.2 mg/dL (ref >39.00)
LDL CALC: 77 mg/dL (ref 0–99)
NonHDL: 92.98
Total CHOL/HDL Ratio: 3
Triglycerides: 81 mg/dL (ref 0.0–149.0)
VLDL: 16.2 mg/dL (ref 0.0–40.0)

## 2016-06-19 LAB — TSH: TSH: 1.27 u[IU]/mL (ref 0.35–4.50)

## 2016-06-19 NOTE — Patient Instructions (Signed)
It was a pleasure to see you today Continue to keep up the good with with physical activity and healthy diet You have picked up a few pounds recently - keep an eye on this.    Wt Readings from Last 3 Encounters:  06/19/16 161 lb 12.8 oz (73.392 kg)  12/13/15 145 lb 9.6 oz (66.044 kg)  06/12/15 148 lb 9.6 oz (67.405 kg)   I'll be in touch with your labs, let's visit in 6 months

## 2016-06-19 NOTE — Addendum Note (Signed)
Addended by: Lamar Blinks C on: 06/19/2016 07:34 PM   Modules accepted: Orders

## 2016-06-19 NOTE — Progress Notes (Signed)
Shawn Santos is a 70 y.o. male who presents for Medicare Annual/Subsequent preventive examination.   Preventive Screening-Counseling & Management  Tobacco History  Smoking status  . Never Smoker   Smokeless tobacco  . Never Used    Problems Prior to Visit 1. Asthma- last seen for this in December. At that time he was using proair 2-3x a day on some days, and also adviar BID and singulair.  We increased his adviar dosage.  He saw allergy- Dr. Fredderick Phenix and is doing much better. His asthma is under much better control currently  Elevated PSA- followed by urology.seen 2 months ago for annual visit. His PSA went up again, they plan a bx 08/01/2016.  This has him a bit stressed out. He has had a bx in the past. Which was benign  He has noted some issues with his hearing but it not yet at the point of considering hearing aids, "I'm too proud"  He is interested in having a complete derm skin exam, has not had one in a couple of years  Body mass index is 26.92 kg/(m^2).   Current Problems (verified) Patient Active Problem List   Diagnosis Date Noted  . Mixed hyperlipidemia 12/05/2014  . Bloating 09/16/2014  . Proteinuria 05/30/2014  . Eosinophilia 02/01/2013  . Elevated PSA 02/01/2013  . HTN (hypertension) 07/27/2012  . Insomnia 07/27/2012  . Hypothyroid 07/27/2012  . ANEMIA, IRON DEFICIENCY 04/18/2008  . CELIAC SPRUE 04/18/2008  . DUODENITIS, WITHOUT HEMORRHAGE 06/02/2007    Medications Prior to Visit Current Outpatient Prescriptions on File Prior to Visit  Medication Sig Dispense Refill  . albuterol (PROVENTIL HFA;VENTOLIN HFA) 108 (90 BASE) MCG/ACT inhaler Inhale 2 puffs into the lungs every 6 (six) hours as needed for wheezing or shortness of breath. 1 Inhaler 11  . aspirin 81 MG tablet Take 81 mg by mouth daily.    Marland Kitchen atorvastatin (LIPITOR) 20 MG tablet TAKE 1 TABLET (20 MG TOTAL) BY MOUTH DAILY. 90 tablet 3  . Cholecalciferol (D3 ADULT PO) Take 2,000 Int'l Units by mouth  daily.    . fluticasone (FLONASE) 50 MCG/ACT nasal spray Place 2 sprays into both nostrils daily. 16 g 6  . Fluticasone-Salmeterol (ADVAIR DISKUS) 250-50 MCG/DOSE AEPB Inhale 1 puff into the lungs 2 (two) times daily. 1 each 11  . levothyroxine (SYNTHROID, LEVOTHROID) 75 MCG tablet TAKE 1 TABLET BY MOUTH EVERY DAY BEFORE BREAKFAST 90 tablet 3  . lisinopril (PRINIVIL,ZESTRIL) 5 MG tablet Take 1 tablet (5 mg total) by mouth daily. for high blood pressure 90 tablet 0  . montelukast (SINGULAIR) 10 MG tablet TAKE 1 TABLET (10 MG TOTAL) BY MOUTH AT BEDTIME 90 tablet 0  . Multiple Vitamin (MULTI VITAMIN DAILY PO) Take by mouth. Reported on 12/13/2015    . PROAIR HFA 108 (90 BASE) MCG/ACT inhaler INHALE 2 PUFFS INTO THE LUNGS EVERY 6 HOURS AS NEEDED FOR SHORTNESS OF BREATH OR WHEEZING 8.5 Inhaler 3  . traZODone (DESYREL) 50 MG tablet Take 0.5-1 tablets (25-50 mg total) by mouth at bedtime as needed for sleep. 90 tablet 2   No current facility-administered medications on file prior to visit.    Current Medications (verified) Current Outpatient Prescriptions  Medication Sig Dispense Refill  . albuterol (PROVENTIL HFA;VENTOLIN HFA) 108 (90 BASE) MCG/ACT inhaler Inhale 2 puffs into the lungs every 6 (six) hours as needed for wheezing or shortness of breath. 1 Inhaler 11  . aspirin 81 MG tablet Take 81 mg by mouth daily.    Marland Kitchen  atorvastatin (LIPITOR) 20 MG tablet TAKE 1 TABLET (20 MG TOTAL) BY MOUTH DAILY. 90 tablet 3  . Cholecalciferol (D3 ADULT PO) Take 2,000 Int'l Units by mouth daily.    . fluticasone (FLONASE) 50 MCG/ACT nasal spray Place 2 sprays into both nostrils daily. 16 g 6  . Fluticasone-Salmeterol (ADVAIR DISKUS) 250-50 MCG/DOSE AEPB Inhale 1 puff into the lungs 2 (two) times daily. 1 each 11  . levothyroxine (SYNTHROID, LEVOTHROID) 75 MCG tablet TAKE 1 TABLET BY MOUTH EVERY DAY BEFORE BREAKFAST 90 tablet 3  . lisinopril (PRINIVIL,ZESTRIL) 5 MG tablet Take 1 tablet (5 mg total) by mouth daily.  for high blood pressure 90 tablet 0  . montelukast (SINGULAIR) 10 MG tablet TAKE 1 TABLET (10 MG TOTAL) BY MOUTH AT BEDTIME 90 tablet 0  . Multiple Vitamin (MULTI VITAMIN DAILY PO) Take by mouth. Reported on 12/13/2015    . PROAIR HFA 108 (90 BASE) MCG/ACT inhaler INHALE 2 PUFFS INTO THE LUNGS EVERY 6 HOURS AS NEEDED FOR SHORTNESS OF BREATH OR WHEEZING 8.5 Inhaler 3  . traZODone (DESYREL) 50 MG tablet Take 0.5-1 tablets (25-50 mg total) by mouth at bedtime as needed for sleep. 90 tablet 2   No current facility-administered medications for this visit.     Allergies (verified) Review of patient's allergies indicates no known allergies.   PAST HISTORY  Family History Family History  Problem Relation Age of Onset  . Alcohol abuse Mother   . COPD Father   . Diabetes Son   . Colon cancer Neg Hx   . Esophageal cancer Neg Hx   . Rectal cancer Neg Hx   . Stomach cancer Neg Hx     Social History Social History  Substance Use Topics  . Smoking status: Never Smoker   . Smokeless tobacco: Never Used  . Alcohol Use: No     Comment: RARELY BEER    Are there smokers in your home (other than you)?  Yes  Risk Factors Current exercise habits: Home exercise routine includes sports, stays active in general.  Dietary issues discussed: doing well   Cardiac risk factors: advanced age (older than 29 for men, 57 for women).  Depression Screen (Note: if answer to either of the following is "Yes", a more complete depression screening is indicated)   Q1: Over the past two weeks, have you felt down, depressed or hopeless? No  Q2: Over the past two weeks, have you felt little interest or pleasure in doing things? No  Have you lost interest or pleasure in daily life? No  Do you often feel hopeless? No  Do you cry easily over simple problems? No  Activities of Daily Living In your present state of health, do you have any difficulty performing the following activities?:  Driving? No Managing  money?  No Feeding yourself? No Getting from bed to chair? No Climbing a flight of stairs? No Preparing food and eating?: No Bathing or showering? No Getting dressed: No Getting to the toilet? No Using the toilet:No Moving around from place to place: No In the past year have you fallen or had a near fall?:No   Are you sexually active?  No  Do you have more than one partner?  No  Hearing Difficulties: Yes Do you often ask people to speak up or repeat themselves? Yes Do you experience ringing or noises in your ears? No Do you have difficulty understanding soft or whispered voices? Yes   Do you feel that you have a problem with  memory? No  Do you often misplace items? No  Do you feel safe at home?  Yes  Cognitive Testing  Alert? Yes  Normal Appearance?Yes  Oriented to person? Yes  Place? Yes   Time? Yes  Recall of three objects?  Yes  Can perform simple calculations? Yes  Displays appropriate judgment?Yes  Can read the correct time from a watch face?Yes   Advanced Directives have been discussed with the patient? Yes  Apple, boy, wagon List the Names of Other Physician/Practitioners you currently use: 1.  [facility]   Indicate any recent Medical Services you may have received from other than [facility] providers in the past year (date may be approximate).  Immunization History  Administered Date(s) Administered  . Influenza Split 09/13/2013  . Influenza,inj,Quad PF,36+ Mos 12/05/2014, 10/27/2015  . Pneumococcal Conjugate-13 12/05/2014  . Pneumococcal Polysaccharide-23 11/02/2007  . Td 03/19/2005  . Tdap 06/12/2015  . Zoster 12/03/2013    Screening Tests Health Maintenance  Topic Date Due  . Hepatitis C Screening  March 06, 1946  . PNA vac Low Risk Adult (2 of 2 - PPSV23) 12/06/2015  . INFLUENZA VACCINE  07/30/2016  . COLONOSCOPY  08/16/2024  . TETANUS/TDAP  06/11/2025  . ZOSTAVAX  Completed    All answers were reviewed with the patient and necessary referrals  were made:  COPLAND,JESSICA, MD   06/19/2016   History reviewed: current medications, past family history, past medical history, past social history, past surgical history and problem list  Review of Systems Pertinent items noted in HPI and remainder of comprehensive ROS otherwise negative.    Objective:     Vision by Snellen chart: right eye:20/20, left eye:20/20 There were no vitals taken for this visit. There is no weight on file to calculate BMI.  BP 138/88 mmHg  Pulse 93  Temp(Src) 98.1 F (36.7 C) (Oral)  Ht 5\' 5"  (1.651 m)  Wt 161 lb 12.8 oz (73.392 kg)  BMI 26.92 kg/m2  SpO2 98%  General Appearance:    Alert, cooperative, no distress, appears stated age  Head:    Normocephalic, without obvious abnormality, atraumatic  Eyes:    PERRL, conjunctiva/corneas clear, EOM's intact, fundi    benign, both eyes       Ears:    Normal TM's and external ear canals, both ears  Nose:   Nares normal, septum midline, mucosa normal, no drainage    or sinus tenderness  Throat:   Lips, mucosa, and tongue normal; teeth and gums normal  Neck:   Supple, symmetrical, trachea midline, no adenopathy;       thyroid:  No enlargement/tenderness/nodules; no carotid   bruit or JVD  Back:     Symmetric, no curvature, ROM normal, no CVA tenderness  Lungs:     Clear to auscultation bilaterally, respirations unlabored  Chest wall:    No tenderness or deformity  Heart:    Regular rate and rhythm, S1 and S2 normal, no murmur, rub   or gallop  Abdomen:     Soft, non-tender, bowel sounds active all four quadrants,    no masses, no organomegaly  Genitalia:    Normal male without lesion, discharge or tenderness  Rectal:    Normal tone, normal prostate, no masses or tenderness;   guaiac negative stool  Extremities:   Extremities normal, atraumatic, no cyanosis or edema  Pulses:   2+ and symmetric all extremities  Skin:   Skin color, texture, turgor normal, no rashes or lesions  Lymph nodes:   Cervical,  supraclavicular, and axillary nodes normal  Neurologic:   CNII-XII intact. Normal strength, sensation and reflexes      throughout       Assessment:     Physical exam  Multiple nevi - Plan: Ambulatory referral to Dermatology  Screening for deficiency anemia - Plan: CBC  Screening for diabetes mellitus - Plan: Comprehensive metabolic panel  Screening examination for infectious disease - Plan: Hepatitis C antibody  Hypothyroidism due to acquired atrophy of thyroid - Plan: TSH  Medication monitoring encounter - Plan: CBC, Comprehensive metabolic panel, Lipid panel  Hyperlipidemia - Plan: Lipid panel       Plan:     During the course of the visit the patient was educated and counseled about appropriate screening and preventive services including:    Diabetes screening  Hep c screening  Monitor his TSH  Monitor lipids  Skin cancer screening  Def anemia screening  Diet review for nutrition referral? Yes ____  Not Indicated _x___ Fasting today for labs   Patient Instructions (the written plan) was given to the patient.  Medicare Attestation I have personally reviewed: The patient's medical and social history Their use of alcohol, tobacco or illicit drugs Their current medications and supplements The patient's functional ability including ADLs,fall risks, home safety risks, cognitive, and hearing and visual impairment Diet and physical activities Evidence for depression or mood disorders  The patient's weight, height, BMI, and visual acuity have been recorded in the chart.  I have made referrals, counseling, and provided education to the patient based on review of the above and I have provided the patient with a written personalized care plan for preventive services.     Lamar Blinks, MD   06/19/2016

## 2016-06-20 LAB — HEPATITIS C ANTIBODY: HCV AB: NEGATIVE

## 2016-06-22 ENCOUNTER — Encounter: Payer: Self-pay | Admitting: Family Medicine

## 2016-06-25 ENCOUNTER — Encounter: Payer: Self-pay | Admitting: Family Medicine

## 2016-06-25 NOTE — Telephone Encounter (Signed)
Pt has been scheduled for an appointment tomorrow afternoon for a ear irrigation. FYI.

## 2016-06-26 ENCOUNTER — Encounter: Payer: Self-pay | Admitting: Family Medicine

## 2016-06-26 ENCOUNTER — Ambulatory Visit (INDEPENDENT_AMBULATORY_CARE_PROVIDER_SITE_OTHER): Payer: Medicare Other | Admitting: Family Medicine

## 2016-06-26 DIAGNOSIS — H6123 Impacted cerumen, bilateral: Secondary | ICD-10-CM

## 2016-06-26 NOTE — Progress Notes (Signed)
[place] [facility] [address] [place], [place] [address] [phone]  Date:  06/26/2016   Name:  Shawn Santos   DOB:  1946-07-24   MRN:  [id number]  PCP:  Lamar Blinks, MD    Chief Complaint: Cerumen Impaction   History of Present Illness:  Shawn Santos is a 70 y.o. very pleasant male patient who presents with the following:  Here today seeking irrigation for excessive ear wax. He has used debrox at home and tried self- irrigation at home but it did not work We did wash his ears out a few years ago but he had pain and did not want to repeat this. However his ears are now so bothersome that he would like to go ahead.  He notes that he is having a hard time hearing but no pain  Patient Active Problem List   Diagnosis Date Noted  . Mixed hyperlipidemia 12/05/2014  . Bloating 09/16/2014  . Proteinuria 05/30/2014  . Eosinophilia 02/01/2013  . Elevated PSA 02/01/2013  . HTN (hypertension) 07/27/2012  . Insomnia 07/27/2012  . Hypothyroid 07/27/2012  . ANEMIA, IRON DEFICIENCY 04/18/2008  . CELIAC SPRUE 04/18/2008  . DUODENITIS, WITHOUT HEMORRHAGE 06/02/2007    Past Medical History  Diagnosis Date  . Hypertension   . Thyroid disease   . Insomnia   . PSA elevation   . Celiac disease   . Allergy   . Asthma   . Hyperlipidemia   . Chronic kidney disease     nephrotic syndrome    Past Surgical History  Procedure Laterality Date  . Eye surgery      cataract- both eyes  . Colonoscopy    . Upper gastrointestinal endoscopy      Social History  Substance Use Topics  . Smoking status: Never Smoker   . Smokeless tobacco: Never Used  . Alcohol Use: No     Comment: RARELY BEER    Family History  Problem Relation Age of Onset  . Alcohol abuse Mother   . COPD Father   . Diabetes Son   . Colon cancer Neg Hx   . Esophageal cancer Neg Hx   . Rectal cancer Neg Hx   . Stomach cancer Neg Hx     No Known  Allergies  Medication list has been reviewed and updated.  Current Outpatient Prescriptions on File Prior to Visit  Medication Sig Dispense Refill  . aspirin 81 MG tablet Take 81 mg by mouth daily. Reported on 06/19/2016    . atorvastatin (LIPITOR) 20 MG tablet TAKE 1 TABLET (20 MG TOTAL) BY MOUTH DAILY. 90 tablet 3  . fluticasone (FLONASE) 50 MCG/ACT nasal spray Place 2 sprays into both nostrils daily. 16 g 6  . levocetirizine (XYZAL) 5 MG tablet Take 1 tablet by mouth daily.  4  . levofloxacin (LEVAQUIN) 500 MG tablet ONE TABLET THE DAY BEFORE PROCEDURE, THE DAY OF PROCEDURE, AND THE DAY AFTER PROCEDURE  0  . levothyroxine (SYNTHROID, LEVOTHROID) 75 MCG tablet TAKE 1 TABLET BY MOUTH EVERY DAY BEFORE BREAKFAST 90 tablet 3  . lisinopril (PRINIVIL,ZESTRIL) 5 MG tablet Take 1 tablet (5 mg total) by mouth daily. for high blood pressure 90 tablet 0  . montelukast (SINGULAIR) 10 MG tablet TAKE 1 TABLET (10 MG TOTAL) BY MOUTH AT BEDTIME 90 tablet 0  . PROAIR HFA 108 (90 BASE) MCG/ACT inhaler INHALE 2 PUFFS INTO THE LUNGS EVERY 6 HOURS AS NEEDED FOR SHORTNESS  OF BREATH OR WHEEZING 8.5 Inhaler 3  . SYMBICORT 160-4.5 MCG/ACT inhaler Inhale 2 puffs into the lungs 2 (two) times daily.  3  . traZODone (DESYREL) 50 MG tablet Take 0.5-1 tablets (25-50 mg total) by mouth at bedtime as needed for sleep. 90 tablet 2   No current facility-administered medications on file prior to visit.    Review of Systems:  As per HPI- otherwise negative.   Physical Examination:  Ideal Body Weight:    GEN: WDWN, NAD, Non-toxic, A & O x 43m, looks well HEENT: Atraumatic, Normocephalic. Neck supple. No masses, No LAD. Ears and Nose: No external deformity. Large amount of impacted cerumen in both ears  NEURO Normal gait.  PSYCH: Normally interactive. Conversant. Not depressed or anxious appearing.  Calm demeanor.   VC obtained.  Ears prepped with colace and then irrigated.  Copious irrigation and some work with  curette required to remove a large amount of tightly impacted wax from both ears.  TM then normal.  Pt felt much better and noted hearing improved Assessment and Plan: Cerumen impaction, bilateral  Resolved successfully as above  Signed Lamar Blinks, MD

## 2016-06-26 NOTE — Progress Notes (Signed)
Pre visit review using our clinic review tool, if applicable. No additional management support is needed unless otherwise documented below in the visit note. 

## 2016-07-11 ENCOUNTER — Other Ambulatory Visit: Payer: Self-pay | Admitting: Physician Assistant

## 2016-07-16 ENCOUNTER — Encounter: Payer: Self-pay | Admitting: Family Medicine

## 2016-07-16 MED ORDER — LISINOPRIL 5 MG PO TABS: 5.0000 mg | ORAL_TABLET | Freq: Every day | ORAL | Status: DC

## 2016-07-16 NOTE — Telephone Encounter (Signed)
Lisinopril sent to [facility] pharmacy.

## 2016-07-28 ENCOUNTER — Other Ambulatory Visit: Payer: Self-pay | Admitting: Family Medicine

## 2016-07-30 ENCOUNTER — Other Ambulatory Visit: Payer: Self-pay | Admitting: Emergency Medicine

## 2016-07-30 DIAGNOSIS — E785 Hyperlipidemia, unspecified: Secondary | ICD-10-CM

## 2016-07-30 MED ORDER — ATORVASTATIN CALCIUM 20 MG PO TABS: ORAL_TABLET | ORAL | 1 refills | Status: DC

## 2016-08-08 ENCOUNTER — Encounter: Payer: Self-pay | Admitting: Family Medicine

## 2016-08-08 ENCOUNTER — Other Ambulatory Visit: Payer: Medicare Other

## 2016-08-29 ENCOUNTER — Encounter: Payer: Self-pay | Admitting: Family Medicine

## 2016-09-06 ENCOUNTER — Other Ambulatory Visit: Payer: Self-pay | Admitting: Physician Assistant

## 2016-09-11 ENCOUNTER — Encounter: Payer: Self-pay | Admitting: Family Medicine

## 2016-09-14 ENCOUNTER — Other Ambulatory Visit: Payer: Self-pay | Admitting: Physician Assistant

## 2016-09-20 ENCOUNTER — Ambulatory Visit: Payer: Medicare Other

## 2016-09-20 ENCOUNTER — Ambulatory Visit (INDEPENDENT_AMBULATORY_CARE_PROVIDER_SITE_OTHER): Payer: Medicare Other

## 2016-09-20 DIAGNOSIS — Z23 Encounter for immunization: Secondary | ICD-10-CM | POA: Diagnosis not present

## 2016-09-20 NOTE — Progress Notes (Signed)
Pre visit review using our clinic review tool, if applicable. No additional management support is needed unless otherwise documented below in the visit note.  Pt here for flu vaccine.  Requested high dose.  Pt tolerated injection well. No signs of reaction upon leaving the clinic.

## 2016-09-21 ENCOUNTER — Other Ambulatory Visit: Payer: Self-pay | Admitting: Physician Assistant

## 2016-10-02 ENCOUNTER — Encounter: Payer: Self-pay | Admitting: Family Medicine

## 2016-10-02 DIAGNOSIS — F5101 Primary insomnia: Secondary | ICD-10-CM

## 2016-10-02 MED ORDER — TRAZODONE HCL 50 MG PO TABS: 25.0000 mg | ORAL_TABLET | Freq: Every evening | ORAL | 2 refills | Status: DC | PRN

## 2016-10-19 ENCOUNTER — Other Ambulatory Visit: Payer: Self-pay | Admitting: Physician Assistant

## 2016-12-19 ENCOUNTER — Encounter: Payer: Self-pay | Admitting: Family Medicine

## 2016-12-19 ENCOUNTER — Ambulatory Visit (INDEPENDENT_AMBULATORY_CARE_PROVIDER_SITE_OTHER): Payer: Medicare Other | Admitting: Family Medicine

## 2016-12-19 VITALS — BP 128/74 | HR 91 | Temp 97.9°F | Ht 65.0 in | Wt 160.2 lb

## 2016-12-19 DIAGNOSIS — D72829 Elevated white blood cell count, unspecified: Secondary | ICD-10-CM

## 2016-12-19 DIAGNOSIS — F5101 Primary insomnia: Secondary | ICD-10-CM

## 2016-12-19 DIAGNOSIS — Z87448 Personal history of other diseases of urinary system: Secondary | ICD-10-CM

## 2016-12-19 DIAGNOSIS — E782 Mixed hyperlipidemia: Secondary | ICD-10-CM

## 2016-12-19 DIAGNOSIS — R1012 Left upper quadrant pain: Secondary | ICD-10-CM

## 2016-12-19 DIAGNOSIS — E034 Atrophy of thyroid (acquired): Secondary | ICD-10-CM

## 2016-12-19 LAB — POC URINALSYSI DIPSTICK (AUTOMATED)
BILIRUBIN UA: NEGATIVE
Glucose, UA: NEGATIVE
KETONES UA: NEGATIVE
Leukocytes, UA: NEGATIVE
NITRITE UA: NEGATIVE
PH UA: 5.5
Protein, UA: NEGATIVE
RBC UA: NEGATIVE
Urobilinogen, UA: 0.2

## 2016-12-19 LAB — CBC
HEMATOCRIT: 49.5 % (ref 39.0–52.0)
HEMOGLOBIN: 16.6 g/dL (ref 13.0–17.0)
MCHC: 33.5 g/dL (ref 30.0–36.0)
MCV: 93 fl (ref 78.0–100.0)
PLATELETS: 259 10*3/uL (ref 150.0–400.0)
RBC: 5.32 Mil/uL (ref 4.22–5.81)
RDW: 13.9 % (ref 11.5–15.5)
WBC: 9.9 10*3/uL (ref 4.0–10.5)

## 2016-12-19 LAB — URINALYSIS, MICROSCOPIC ONLY

## 2016-12-19 LAB — TSH: TSH: 2.47 u[IU]/mL (ref 0.35–4.50)

## 2016-12-19 LAB — LIPASE: LIPASE: 20 U/L (ref 11.0–59.0)

## 2016-12-19 NOTE — Progress Notes (Signed)
Pre visit review using our clinic review tool, if applicable. No additional management support is needed unless otherwise documented below in the visit note. 

## 2016-12-19 NOTE — Patient Instructions (Addendum)
It was great to see you again as always- have a wonderful time in [place]!   I will be in touch with your labs- we will check your urine for protein, will make sure that your white blood cell count is back to normal, will check your thyroid and also your pancreatic function due to your post- eating pain  You had your pneumovax when you were just shy of age 70- technically you are due for a booster,  However as you had this so close to age 11 I might wait another few years as you likely still have good immunity

## 2016-12-19 NOTE — Progress Notes (Signed)
[place] at [facility] [address] [place], [place] [address] [phone] [id number] [phone]  Date:  12/19/2016   Name:  Shawn Santos   DOB:  14-Jan-1946   MRN:  [id number]  PCP:  Lamar Blinks, MD    Chief Complaint: Follow-up (Pt here for 6 month follow up visit. Pt states that both ears are doing well. )   History of Present Illness:  Shawn Santos is a 70 y.o. very pleasant male patient who presents with the following:  Here today for a follow-up visit.   Hypothyroidism, hyperlipidemia, HTN, insomnia, BPH, history of proteinuria that has been resolved Flu shot is done for the year. He is UTD on tetanus, zostavax  Lab Results  Component Value Date   TSH 1.27 06/19/2016   He is seeing Derm- Dr. Derrel Nip.  They did some recent cryotherpay for AKs on his back.  He does have a history of Celiac Sprue- he will sometimes notice a pressure in his left upper quadrant after eating only. This will occur only after he eats a large meal.  Will last a couple of hours.  It may occur maybe once a week.   No vomiting, diarrhea  Wt Readings from Last 3 Encounters:  12/19/16 160 lb 3.2 oz (72.7 kg)  06/19/16 161 lb 12.8 oz (73.4 kg)  12/13/15 145 lb 9.6 oz (66 kg)   He does not have any urinary sx  He had a prostate bx in October- this was his 2nd bx. All looked good- they will repeat his PSA in 6 months.   TSH, cholesterol looked fine in June  He is using trazodone for sleep- this is working well.  He uses 50 mg  He had pneumovax just shy of ager 89 He is active, likes to walk and work outdoors Energy level and mood is good  Patient Active Problem List   Diagnosis Date Noted  . Mixed hyperlipidemia 12/05/2014  . Bloating 09/16/2014  . Proteinuria 05/30/2014  . Eosinophilia 02/01/2013  . Elevated PSA 02/01/2013  . HTN (hypertension) 07/27/2012  . Insomnia 07/27/2012  . Hypothyroid 07/27/2012  . ANEMIA, IRON DEFICIENCY 04/18/2008   . CELIAC SPRUE 04/18/2008  . DUODENITIS, WITHOUT HEMORRHAGE 06/02/2007    Past Medical History:  Diagnosis Date  . Allergy   . Asthma   . Celiac disease   . Chronic kidney disease    nephrotic syndrome  . Hyperlipidemia   . Hypertension   . Insomnia   . PSA elevation   . Thyroid disease     Past Surgical History:  Procedure Laterality Date  . COLONOSCOPY    . EYE SURGERY     cataract- both eyes  . UPPER GASTROINTESTINAL ENDOSCOPY      Social History  Substance Use Topics  . Smoking status: Never Smoker  . Smokeless tobacco: Never Used  . Alcohol use No     Comment: RARELY BEER    Family History  Problem Relation Age of Onset  . Alcohol abuse Mother   . COPD Father   . Diabetes Son   . Colon cancer Neg Hx   . Esophageal cancer Neg Hx   . Rectal cancer Neg Hx   . Stomach cancer Neg Hx     No Known Allergies  Medication list has been reviewed and updated.  Current Outpatient Prescriptions on File Prior to Visit  Medication Sig Dispense Refill  . aspirin 81 MG tablet Take 81 mg by  mouth daily. Reported on 06/19/2016    . atorvastatin (LIPITOR) 20 MG tablet TAKE 1 TABLET (20 MG TOTAL) BY MOUTH DAILY. 90 tablet 1  . fluticasone (FLONASE) 50 MCG/ACT nasal spray Place 2 sprays into both nostrils daily. 16 g 6  . levocetirizine (XYZAL) 5 MG tablet Take 1 tablet by mouth daily.  4  . levofloxacin (LEVAQUIN) 500 MG tablet ONE TABLET THE DAY BEFORE PROCEDURE, THE DAY OF PROCEDURE, AND THE DAY AFTER PROCEDURE  0  . levothyroxine (SYNTHROID, LEVOTHROID) 75 MCG tablet TAKE 1 TABLET BY MOUTH EVERY DAY BEFORE BREAKFAST 90 tablet 3  . lisinopril (PRINIVIL,ZESTRIL) 5 MG tablet Take 1 tablet (5 mg total) by mouth daily. for high blood pressure 90 tablet 1  . montelukast (SINGULAIR) 10 MG tablet TAKE 1 TABLET (10 MG TOTAL) BY MOUTH AT BEDTIME 90 tablet 0  . PROAIR HFA 108 (90 BASE) MCG/ACT inhaler INHALE 2 PUFFS INTO THE LUNGS EVERY 6 HOURS AS NEEDED FOR SHORTNESS OF BREATH OR  WHEEZING 8.5 Inhaler 3  . SYMBICORT 160-4.5 MCG/ACT inhaler Inhale 2 puffs into the lungs 2 (two) times daily.  3  . traZODone (DESYREL) 50 MG tablet Take 0.5-1 tablets (25-50 mg total) by mouth at bedtime as needed for sleep. 90 tablet 2   No current facility-administered medications on file prior to visit.     Review of Systems:  As per HPI- otherwise negative. No fever, chills, weight change, cp or SOB  Physical Examination: Vitals:   12/19/16 0844  BP: 128/74  Pulse: 91  Temp: 97.9 F (36.6 C)   Vitals:   12/19/16 0844  Weight: 160 lb 3.2 oz (72.7 kg)  Height: 5\' 5"  (1.651 m)   Body mass index is 26.66 kg/m. Ideal Body Weight: Weight in (lb) to have BMI = 25: 149.9  GEN: WDWN, NAD, Non-toxic, A & O x 3, looks well and very healthy for age 63: Atraumatic, Normocephalic. Neck supple. No masses, No LAD.  Bilateral TM wnl, oropharynx normal.  PEERL,EOMI.   Ears and Nose: No external deformity. CV: RRR, No M/G/R. No JVD. No thrill. No extra heart sounds. PULM: CTA B, no wheezes, crackles, rhonchi. No retractions. No resp. distress. No accessory muscle use. ABD: S, NT, ND, +BS. No rebound. No HSM.  Benign belly EXTR: No c/c/e NEURO Normal gait.  PSYCH: Normally interactive. Conversant. Not depressed or anxious appearing.  Calm demeanor.    Assessment and Plan: History of proteinuria syndrome - Plan: POCT urinalysis dipstick, Urine Microscopic Only  Primary insomnia  Hypothyroidism due to acquired atrophy of thyroid - Plan: TSH  Mixed hyperlipidemia  Abdominal pain, left upper quadrant - Plan: Lipase  Leukocytosis, unspecified type - Plan: CBC  Here today for a follow-up visit.  He is doing very well Trazodone is working for sleep.  Due for a TSH check- will do this for him today He has a history of proteinuria- will eval for him today as well Lipase due to some occasional LUQ pain after eating Recent lipids looked good BP under good control  Slight  increase in WBC count at last labs- recheck today    Signed Lamar Blinks, MD

## 2017-01-08 ENCOUNTER — Encounter: Payer: Self-pay | Admitting: Family Medicine

## 2017-01-08 ENCOUNTER — Other Ambulatory Visit: Payer: Self-pay | Admitting: Family Medicine

## 2017-01-13 ENCOUNTER — Encounter: Payer: Self-pay | Admitting: Family Medicine

## 2017-01-19 ENCOUNTER — Other Ambulatory Visit: Payer: Self-pay | Admitting: Family Medicine

## 2017-01-19 DIAGNOSIS — E785 Hyperlipidemia, unspecified: Secondary | ICD-10-CM

## 2017-01-20 ENCOUNTER — Other Ambulatory Visit: Payer: Self-pay | Admitting: Emergency Medicine

## 2017-01-20 DIAGNOSIS — E785 Hyperlipidemia, unspecified: Secondary | ICD-10-CM

## 2017-01-20 MED ORDER — ATORVASTATIN CALCIUM 20 MG PO TABS: ORAL_TABLET | ORAL | 1 refills | Status: DC

## 2017-06-19 ENCOUNTER — Other Ambulatory Visit: Payer: Self-pay | Admitting: Family Medicine

## 2017-06-19 DIAGNOSIS — F5101 Primary insomnia: Secondary | ICD-10-CM

## 2017-06-21 NOTE — Progress Notes (Addendum)
[place] at [facility] [address] [place], [place] [address] [phone]  Date:  06/23/2017   Name:  Shawn Santos   DOB:  13-Jun-1946   MRN:  [id number]  PCP:  Shawn Mclean, MD    Chief Complaint: Follow-up (Pt here for 6 month f/u visit/.)   History of Present Illness:  Shawn Santos is a 71 y.o. very pleasant male patient who presents with the following:  Here today for a follow-up visit.  Last seen by myself in December with the following HPI:  Here today for a follow-up visit.   Hypothyroidism, hyperlipidemia, HTN, insomnia, BPH, history of proteinuria that has been resolved Flu shot is done for the year. He is UTD on tetanus, zostavax  RecentLabs       Lab Results  Component Value Date   TSH 1.27 06/19/2016     He is seeing Derm- Shawn Santos.  They did some recent cryotherpay for AKs on his back.  He does have a history of Celiac Sprue- he will sometimes notice a pressure in his left upper quadrant after eating only. This will occur only after he eats a large meal.  Will last a couple of hours.  It may occur maybe once a week.  No vomiting, diarrhea     Wt Readings from Last 3 Encounters:  12/19/16 160 lb 3.2 oz (72.7 kg)  06/19/16 161 lb 12.8 oz (73.4 kg)  12/13/15 145 lb 9.6 oz (66 kg)   He does not have any urinary sx He had a prostate bx in October- this was his 2nd bx. All looked good- they will repeat his PSA in 6 months.  TSH, cholesterol looked fine in June He is using trazodone for sleep- this is working well.  He uses 50 mg He had pneumovax just shy of age 51 He is active, likes to walk and work outdoors Energy level and mood is good  Due for complete labs today.  He is fasting today He does see urology as above- he does not need a PSA today- at last visit he gave him a year follow-up and he will have his PSA done per urology He is feeling well today.  States that he has no  complaints, his energy level is great, he is playing a lot of softball and really enjoying it He is concerned that his right ear may be blocked with wax today  Shawn Santos changed his steroid to flovent- this is ok but he does think that his other steroid worked better perhaps. He sees him in September and will discuss further His asthma has been under control  Colonoscopy 3 years ago tdap is UTD Flu shot is UTD  His son Shawn Santos is doing well.  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  He enjoys this but does miss cooking.  He does have DM- they are thinking about a pump for him - with Shawn Santos has had zostavax but would like to get shingrix- will have to do later as we do not have enough supply currently  Pulse on recount approx 100 BPM- Shawn Santos does tend to run on the high side with pulse consistently in the 90s. He feels fine today   Pulse Readings from Last 3 Encounters:  06/23/17 (!) 103  12/19/16 91  06/19/16 93     Patient Active Problem List   Diagnosis  Date Noted  . Mixed hyperlipidemia 12/05/2014  . Bloating 09/16/2014  . Proteinuria 05/30/2014  . Eosinophilia 02/01/2013  . Elevated PSA 02/01/2013  . HTN (hypertension) 07/27/2012  . Insomnia 07/27/2012  . Hypothyroid 07/27/2012  . ANEMIA, IRON DEFICIENCY 04/18/2008  . CELIAC SPRUE 04/18/2008  . DUODENITIS, WITHOUT HEMORRHAGE 06/02/2007    Past Medical History:  Diagnosis Date  . Allergy   . Asthma   . Celiac disease   . Chronic kidney disease    nephrotic syndrome  . Hyperlipidemia   . Hypertension   . Insomnia   . PSA elevation   . Thyroid disease     Past Surgical History:  Procedure Laterality Date  . COLONOSCOPY    . EYE SURGERY     cataract- both eyes  . UPPER GASTROINTESTINAL ENDOSCOPY      Social History  Substance Use Topics  . Smoking status: Never Smoker  . Smokeless tobacco: Never Used  . Alcohol use No     Comment: RARELY BEER    Family History   Problem Relation Age of Onset  . Alcohol abuse Mother   . COPD Father   . Diabetes Son   . Colon cancer Neg Hx   . Esophageal cancer Neg Hx   . Rectal cancer Neg Hx   . Stomach cancer Neg Hx     No Known Allergies  Medication list has been reviewed and updated.  Current Outpatient Prescriptions on File Prior to Visit  Medication Sig Dispense Refill  . aspirin 81 MG tablet Take 81 mg by mouth daily. Reported on 06/19/2016    . atorvastatin (LIPITOR) 20 MG tablet TAKE 1 TABLET (20 MG TOTAL) BY MOUTH DAILY. 90 tablet 1  . fluticasone (FLONASE) 50 MCG/ACT nasal spray Place 2 sprays into both nostrils daily. 16 g 6  . levocetirizine (XYZAL) 5 MG tablet Take 1 tablet by mouth daily.  4  . levothyroxine (SYNTHROID, LEVOTHROID) 75 MCG tablet TAKE 1 TABLET BY MOUTH EVERY DAY BEFORE BREAKFAST 90 tablet 3  . lisinopril (PRINIVIL,ZESTRIL) 5 MG tablet TAKE 1 TABLET (5 MG TOTAL) BY MOUTH DAILY. FOR HIGH BLOOD PRESSURE 90 tablet 1  . montelukast (SINGULAIR) 10 MG tablet TAKE 1 TABLET (10 MG TOTAL) BY MOUTH AT BEDTIME 90 tablet 0  . PROAIR HFA 108 (90 BASE) MCG/ACT inhaler INHALE 2 PUFFS INTO THE LUNGS EVERY 6 HOURS AS NEEDED FOR SHORTNESS OF BREATH OR WHEEZING 8.5 Inhaler 3  . SYMBICORT 160-4.5 MCG/ACT inhaler Inhale 2 puffs into the lungs 2 (two) times daily.  3  . traZODone (DESYREL) 50 MG tablet TAKE 1/2-1 TABLET (25-50 MG TOTAL) BY MOUTH AT BEDTIME AS NEEDED FOR SLEEP. 90 tablet 2   No current facility-administered medications on file prior to visit.     Review of Systems:  As per HPI- otherwise negative.   Physical Examination: Vitals:   06/23/17 0851  BP: 126/82  Pulse: (!) 103  Temp: 98.6 F (37 C)   Vitals:   06/23/17 0851  Weight: 161 lb (73 kg)  Height: 5\' 5"  (1.651 m)   Body mass index is 26.79 kg/m. Ideal Body Weight: Weight in (lb) to have BMI = 25: 149.9  GEN: WDWN, NAD, Non-toxic, A & O x 3 HEENT: Atraumatic, Normocephalic. Neck supple. No masses, No LAD.  LEFT   TM wnl, right is obscured by cerumen.  oropharynx normal.  PEERL,EOMI.   Ears and Nose: No external deformity. CV: RRR, No M/G/R. No JVD. No thrill. No extra heart  sounds. PULM: CTA B, no wheezes, crackles, rhonchi. No retractions. No resp. distress. No accessory muscle use. ABD: S, NT, ND. No rebound. No HSM. EXTR: No c/c/e NEURO Normal gait.  PSYCH: Normally interactive. Conversant. Not depressed or anxious appearing.  Calm demeanor.  After irrigation right TM is wnl  Assessment and Plan: Hypothyroidism due to acquired atrophy of thyroid - Plan: TSH  Mixed hyperlipidemia - Plan: Lipid panel  Screening for deficiency anemia - Plan: CBC  Screening for diabetes mellitus - Plan: Comprehensive metabolic panel  Impacted cerumen of right ear  Here today for a recheck visit Irrigated right ear and removed wax Recommend shingrix but we cannot give to him today Will plan further follow- up pending labs.   Signed Lamar Blinks, MD  Received his labs 6/26  Results for orders placed or performed in visit on 06/23/17  CBC  Result Value Ref Range   WBC 9.5 4.0 - 10.5 K/uL   RBC 5.28 4.22 - 5.81 Mil/uL   Platelets 241.0 150.0 - 400.0 K/uL   Hemoglobin 16.5 13.0 - 17.0 g/dL   HCT 49.3 39.0 - 52.0 %   MCV 93.5 78.0 - 100.0 fl   MCHC 33.4 30.0 - 36.0 g/dL   RDW 14.1 11.5 - 15.5 %  Comprehensive metabolic panel  Result Value Ref Range   Sodium 139 135 - 145 mEq/L   Potassium 4.4 3.5 - 5.1 mEq/L   Chloride 104 96 - 112 mEq/L   CO2 25 19 - 32 mEq/L   Glucose, Bld 107 (H) 70 - 99 mg/dL   BUN 21 6 - 23 mg/dL   Creatinine, Ser 1.40 0.40 - 1.50 mg/dL   Total Bilirubin 0.8 0.2 - 1.2 mg/dL   Alkaline Phosphatase 77 39 - 117 U/L   AST 22 0 - 37 U/L   ALT 24 0 - 53 U/L   Total Protein 7.4 6.0 - 8.3 g/dL   Albumin 4.6 3.5 - 5.2 g/dL   Calcium 9.7 8.4 - 10.5 mg/dL   GFR 52.85 (L) >60.00 mL/min  Lipid panel  Result Value Ref Range   Cholesterol 176 0 - 200 mg/dL   Triglycerides  125.0 0.0 - 149.0 mg/dL   HDL 42.30 >39.00 mg/dL   VLDL 25.0 0.0 - 40.0 mg/dL   LDL Cholesterol 109 (H) 0 - 99 mg/dL   Total CHOL/HDL Ratio 4    NonHDL 134.05   TSH  Result Value Ref Range   TSH 3.01 0.35 - 4.50 uIU/mL

## 2017-06-23 ENCOUNTER — Ambulatory Visit (INDEPENDENT_AMBULATORY_CARE_PROVIDER_SITE_OTHER): Payer: Medicare Other | Admitting: Family Medicine

## 2017-06-23 VITALS — BP 126/82 | HR 103 | Temp 98.6°F | Ht 65.0 in | Wt 161.0 lb

## 2017-06-23 DIAGNOSIS — Z130 Encounter for screening for diseases of the blood and blood-forming organs and certain disorders involving the immune mechanism: Secondary | ICD-10-CM | POA: Diagnosis not present

## 2017-06-23 DIAGNOSIS — H6121 Impacted cerumen, right ear: Secondary | ICD-10-CM | POA: Diagnosis not present

## 2017-06-23 DIAGNOSIS — Z131 Encounter for screening for diabetes mellitus: Secondary | ICD-10-CM

## 2017-06-23 DIAGNOSIS — E782 Mixed hyperlipidemia: Secondary | ICD-10-CM

## 2017-06-23 DIAGNOSIS — E034 Atrophy of thyroid (acquired): Secondary | ICD-10-CM | POA: Diagnosis not present

## 2017-06-23 LAB — COMPREHENSIVE METABOLIC PANEL WITH GFR
ALT: 24 U/L (ref 0–53)
AST: 22 U/L (ref 0–37)
Albumin: 4.6 g/dL (ref 3.5–5.2)
Alkaline Phosphatase: 77 U/L (ref 39–117)
BUN: 21 mg/dL (ref 6–23)
CO2: 25 meq/L (ref 19–32)
Calcium: 9.7 mg/dL (ref 8.4–10.5)
Chloride: 104 meq/L (ref 96–112)
Creatinine, Ser: 1.4 mg/dL (ref 0.40–1.50)
GFR: 52.85 mL/min — ABNORMAL LOW
Glucose, Bld: 107 mg/dL — ABNORMAL HIGH (ref 70–99)
Potassium: 4.4 meq/L (ref 3.5–5.1)
Sodium: 139 meq/L (ref 135–145)
Total Bilirubin: 0.8 mg/dL (ref 0.2–1.2)
Total Protein: 7.4 g/dL (ref 6.0–8.3)

## 2017-06-23 LAB — LIPID PANEL
CHOL/HDL RATIO: 4
Cholesterol: 176 mg/dL (ref 0–200)
HDL: 42.3 mg/dL (ref >39.00)
LDL Cholesterol: 109 mg/dL — ABNORMAL HIGH (ref 0–99)
NONHDL: 134.05
Triglycerides: 125 mg/dL (ref 0.0–149.0)
VLDL: 25 mg/dL (ref 0.0–40.0)

## 2017-06-23 LAB — CBC
HCT: 49.3 % (ref 39.0–52.0)
Hemoglobin: 16.5 g/dL (ref 13.0–17.0)
MCHC: 33.4 g/dL (ref 30.0–36.0)
MCV: 93.5 fl (ref 78.0–100.0)
Platelets: 241 K/uL (ref 150.0–400.0)
RBC: 5.28 Mil/uL (ref 4.22–5.81)
RDW: 14.1 % (ref 11.5–15.5)
WBC: 9.5 K/uL (ref 4.0–10.5)

## 2017-06-23 LAB — TSH: TSH: 3.01 u[IU]/mL (ref 0.35–4.50)

## 2017-06-23 NOTE — Patient Instructions (Signed)
It was a pleasure to see you today as always!  We will be in touch with your labs, and let's plan to visit in 6 months.  We would love for you to have the Shingrix vaccine (new shingles vaccine, 2 doses 2-6 months apart) but we do not have adequate supply right now!  As soon as the excitement dies down we will have more.

## 2017-06-24 ENCOUNTER — Encounter: Payer: Self-pay | Admitting: Family Medicine

## 2017-06-26 ENCOUNTER — Encounter: Payer: Self-pay | Admitting: Family Medicine

## 2017-07-02 ENCOUNTER — Other Ambulatory Visit: Payer: Self-pay | Admitting: Family Medicine

## 2017-07-13 ENCOUNTER — Other Ambulatory Visit: Payer: Self-pay | Admitting: Family Medicine

## 2017-07-13 DIAGNOSIS — E038 Other specified hypothyroidism: Secondary | ICD-10-CM

## 2017-07-13 DIAGNOSIS — E785 Hyperlipidemia, unspecified: Secondary | ICD-10-CM

## 2017-10-12 ENCOUNTER — Encounter: Payer: Self-pay | Admitting: Family Medicine

## 2017-10-14 ENCOUNTER — Ambulatory Visit (INDEPENDENT_AMBULATORY_CARE_PROVIDER_SITE_OTHER): Payer: Medicare Other | Admitting: Behavioral Health

## 2017-10-14 DIAGNOSIS — Z23 Encounter for immunization: Secondary | ICD-10-CM | POA: Diagnosis not present

## 2017-10-14 NOTE — Progress Notes (Signed)
Pre visit review using our clinic review tool, if applicable. No additional management support is needed unless otherwise documented below in the visit note.  Patient came in clinic today for influenza vaccination. IM injection was given in the left deltoid. Patient tolerated the injection well.

## 2017-12-10 ENCOUNTER — Ambulatory Visit: Payer: Medicare Other | Admitting: Family Medicine

## 2017-12-12 ENCOUNTER — Other Ambulatory Visit: Payer: Self-pay | Admitting: Family Medicine

## 2017-12-14 NOTE — Progress Notes (Addendum)
[place] at [facility] [address] [place], [place] [address] [phone]  Date:  12/15/2017   Name:  Shawn Santos   DOB:  04-Nov-1946   MRN:  [id number]  PCP:  Shawn Mclean, MD    Chief Complaint: No chief complaint on file.   History of Present Illness:  Shawn Santos is a 71 y.o. very pleasant male patient who presents with the following:  Here today for a 6 month follow-up visit History of hyperlipidemia, HTN, proteinuria, hypothyroidism From 6 months ago: Due for complete labs today.  He is fasting today He does see urology as above- he does not need a PSA today- at last visit he gave him a year follow-up and he will have his PSA done per urology He is feeling well today.  States that he has no complaints, his energy level is great, he is playing a lot of softball and really enjoying it He is concerned that his right ear may be blocked with wax today  [name] changed his steroid to flovent- this is ok but he does think that his other steroid worked better perhaps. He sees him in September and will discuss further His asthma has been under control [name] has had zostavax but would like to get shingrix- will have to do later as we do not have enough supply currently  Pulse on recount approx 100 BPM- [name] does tend to run on the high side with pulse consistently in the 90s. He feels fine today   My lab notes from his last visit:  Your labs look great as always  Thyroid is in range  Cholesterol looks fine  Blood count is perfect  Metabolic profile is ok- your kidney function is not quite as good as at recent visits but nothing to be worried about at this point. Let's meet in 4-6 months to check on how you are doing. Take care!   Lab Results  Component Value Date   TSH 3.01 06/23/2017   Flu: this was done in October Need to repeat BMP today ?repeat pneumovax for him. He had this in 2008 but he was not yet  63 at that time- he was just shy of 48 He would like to update his pneumovax today  He is seeing urology yearly now and all is going well  BP Readings from Last 3 Encounters:  12/15/17 139/90  06/23/17 126/82  12/19/16 128/74   He is on lisinopril 5 mg- BP a bit high today, but was ok at last visit Will continue to monitor He is feeling well, is active and happy He is traveling to [place]  Patient Active Problem List   Diagnosis Date Noted  . Mixed hyperlipidemia 12/05/2014  . Bloating 09/16/2014  . Proteinuria 05/30/2014  . Eosinophilia 02/01/2013  . Elevated PSA 02/01/2013  . HTN (hypertension) 07/27/2012  . Insomnia 07/27/2012  . Hypothyroid 07/27/2012  . ANEMIA, IRON DEFICIENCY 04/18/2008  . CELIAC SPRUE 04/18/2008  . DUODENITIS, WITHOUT HEMORRHAGE 06/02/2007    Past Medical History:  Diagnosis Date  . Allergy   . Asthma   . Celiac disease   . Chronic kidney disease    nephrotic syndrome  . Hyperlipidemia   . Hypertension   . Insomnia   . PSA elevation   . Thyroid disease     Past Surgical History:  Procedure Laterality Date  . COLONOSCOPY    .  EYE SURGERY     cataract- both eyes  . UPPER GASTROINTESTINAL ENDOSCOPY      Social History   Tobacco Use  . Smoking status: Never Smoker  . Smokeless tobacco: Never Used  Substance Use Topics  . Alcohol use: No    Comment: RARELY BEER  . Drug use: No    Family History  Problem Relation Age of Onset  . Alcohol abuse Mother   . COPD Father   . Diabetes Son   . Colon cancer Neg Hx   . Esophageal cancer Neg Hx   . Rectal cancer Neg Hx   . Stomach cancer Neg Hx     No Known Allergies  Medication list has been reviewed and updated.  Current Outpatient Medications on File Prior to Visit  Medication Sig Dispense Refill  . aspirin 81 MG tablet Take 81 mg by mouth daily. Reported on 06/19/2016    . atorvastatin (LIPITOR) 20 MG tablet TAKE 1 TABLET (20 MG TOTAL) BY MOUTH  DAILY. 90 tablet 1  . FLOVENT HFA 110 MCG/ACT inhaler INHALE 2 PUFFS TWICE A DAY INHALATION 30 DAYS  5  . fluticasone (FLONASE) 50 MCG/ACT nasal spray Place 2 sprays into both nostrils daily. 16 g 6  . levocetirizine (XYZAL) 5 MG tablet Take 1 tablet by mouth daily.  4  . levothyroxine (SYNTHROID, LEVOTHROID) 75 MCG tablet TAKE 1 TABLET BY MOUTH EVERY DAY BEFORE BREAKFAST 90 tablet 3  . lisinopril (PRINIVIL,ZESTRIL) 5 MG tablet TAKE 1 TABLET (5 MG TOTAL) BY MOUTH DAILY. FOR HIGH BLOOD PRESSURE 90 tablet 1  . montelukast (SINGULAIR) 10 MG tablet TAKE 1 TABLET (10 MG TOTAL) BY MOUTH AT BEDTIME (Patient not taking: Reported on 12/15/2017) 90 tablet 0  . PROAIR HFA 108 (90 BASE) MCG/ACT inhaler INHALE 2 PUFFS INTO THE LUNGS EVERY 6 HOURS AS NEEDED FOR SHORTNESS OF BREATH OR WHEEZING 8.5 Inhaler 3  . SYMBICORT 160-4.5 MCG/ACT inhaler Inhale 2 puffs into the lungs 2 (two) times daily.  3  . traZODone (DESYREL) 50 MG tablet TAKE 1/2-1 TABLET (25-50 MG TOTAL) BY MOUTH AT BEDTIME AS NEEDED FOR SLEEP. 90 tablet 2   No current facility-administered medications on file prior to visit.     Review of Systems:  As per HPI- otherwise negative. No fever or chills No CP or SOB No nausea, vomiting or diarrhea   Physical Examination: Vitals:   12/15/17 1041  BP: 139/90  Pulse: 95  Resp: 16  Temp: 97.6 F (36.4 C)  SpO2: 98%   Vitals:   12/15/17 1041  Weight: 157 lb 6.4 oz (71.4 kg)  Height: 5\' 6"  (1.676 m)   Body mass index is 25.41 kg/m. Ideal Body Weight: Weight in (lb) to have BMI = 25: 154.6  GEN: WDWN, NAD, Non-toxic, A & O x 3, looks well HEENT: Atraumatic, Normocephalic. Neck supple. No masses, No LAD.  Bilateral TM wnl, oropharynx normal.  PEERL,EOMI.   Ears and Nose: No external deformity. CV: RRR, No M/G/R. No JVD. No thrill. No extra heart sounds. PULM: CTA B, no wheezes, crackles, rhonchi. No retractions. No resp. distress. No accessory muscle use. EXTR: No c/c/e NEURO Normal  gait.  PSYCH: Normally interactive. Conversant. Not depressed or anxious appearing.  Calm demeanor.    Assessment and Plan: Decreased GFR - Plan: Basic metabolic panel  Hypothyroidism due to acquired atrophy of thyroid - Plan: TSH  Immunization due - Plan: Pneumococcal polysaccharide vaccine 23-valent greater than or equal to 2yo subcutaneous/IM  Hypertension,  unspecified type  Recheck visit today Gave pneumovax  TSH and BMP today  Will plan further follow- up pending labs. BP is a bit high today- will continue to monitor for him   Signed Lamar Blinks, MD  Received his labs 12/18 Thyroid is fine K is minimally high- would like to recheck If persistent could be related to lisinopril  Your thyroid looks fine- continue current dose of synthroid medication Your potassium is slightly high- this may be due to blood processing (sometimes during handling blood cells break open and release potassium). However, high potassium can also be due to lisinopril, and I want to make sure this is not persistent Please come in for a repeat BMP (lab visit only) before you leave for [place] if you can- if not can do when you return.  You can eat normally for the test, and I would encourage you to also be well hydrated.   Otherwise we can plan to visit in 6 months Results for orders placed or performed in visit on 95/32/02  Basic metabolic panel  Result Value Ref Range   Sodium 140 135 - 145 mEq/L   Potassium 5.4 (H) 3.5 - 5.1 mEq/L   Chloride 106 96 - 112 mEq/L   CO2 25 19 - 32 mEq/L   Glucose, Bld 113 (H) 70 - 99 mg/dL   BUN 22 6 - 23 mg/dL   Creatinine, Ser 1.48 0.40 - 1.50 mg/dL   Calcium 10.0 8.4 - 10.5 mg/dL   GFR 49.50 (L) >60.00 mL/min  TSH  Result Value Ref Range   TSH 1.50 0.35 - 4.50 uIU/mL

## 2017-12-15 ENCOUNTER — Ambulatory Visit: Payer: Medicare Other | Admitting: Family Medicine

## 2017-12-15 ENCOUNTER — Encounter: Payer: Self-pay | Admitting: Family Medicine

## 2017-12-15 VITALS — BP 139/90 | HR 95 | Temp 97.6°F | Resp 16 | Ht 66.0 in | Wt 157.4 lb

## 2017-12-15 DIAGNOSIS — Z23 Encounter for immunization: Secondary | ICD-10-CM

## 2017-12-15 DIAGNOSIS — R944 Abnormal results of kidney function studies: Secondary | ICD-10-CM | POA: Diagnosis not present

## 2017-12-15 DIAGNOSIS — E875 Hyperkalemia: Secondary | ICD-10-CM

## 2017-12-15 DIAGNOSIS — E034 Atrophy of thyroid (acquired): Secondary | ICD-10-CM

## 2017-12-15 DIAGNOSIS — I10 Essential (primary) hypertension: Secondary | ICD-10-CM

## 2017-12-15 LAB — BASIC METABOLIC PANEL
BUN: 22 mg/dL (ref 6–23)
CALCIUM: 10 mg/dL (ref 8.4–10.5)
CO2: 25 meq/L (ref 19–32)
Chloride: 106 mEq/L (ref 96–112)
Creatinine, Ser: 1.48 mg/dL (ref 0.40–1.50)
GFR: 49.5 mL/min — ABNORMAL LOW (ref >60.00)
Glucose, Bld: 113 mg/dL — ABNORMAL HIGH (ref 70–99)
POTASSIUM: 5.4 meq/L — AB (ref 3.5–5.1)
SODIUM: 140 meq/L (ref 135–145)

## 2017-12-15 LAB — TSH: TSH: 1.5 u[IU]/mL (ref 0.35–4.50)

## 2017-12-15 NOTE — Patient Instructions (Addendum)
Nexium is a PPI- this is one class of acid reducer medicine There is some evidence now that an H2 blocker may be better for long term use- this would be something like zantac (ranitidine). Try this mediation and see if it does the same job as the nexium If zantac is just as good you can continue it, but you can switch back to nexium if need be You got your last pneumonia vaccine today- you may also want to get the Shingrix shingles series in another year

## 2017-12-16 ENCOUNTER — Encounter: Payer: Self-pay | Admitting: Family Medicine

## 2017-12-16 NOTE — Addendum Note (Signed)
Addended by: Lamar Blinks C on: 12/16/2017 05:36 AM   Modules accepted: Orders

## 2017-12-28 ENCOUNTER — Encounter: Payer: Self-pay | Admitting: Family Medicine

## 2018-01-02 ENCOUNTER — Other Ambulatory Visit (INDEPENDENT_AMBULATORY_CARE_PROVIDER_SITE_OTHER): Payer: Medicare Other

## 2018-01-02 DIAGNOSIS — E875 Hyperkalemia: Secondary | ICD-10-CM

## 2018-01-02 LAB — BASIC METABOLIC PANEL
BUN: 20 mg/dL (ref 6–23)
CALCIUM: 9.8 mg/dL (ref 8.4–10.5)
CHLORIDE: 105 meq/L (ref 96–112)
CO2: 27 meq/L (ref 19–32)
Creatinine, Ser: 1.34 mg/dL (ref 0.40–1.50)
GFR: 55.5 mL/min — ABNORMAL LOW (ref >60.00)
Glucose, Bld: 95 mg/dL (ref 70–99)
POTASSIUM: 5.5 meq/L — AB (ref 3.5–5.1)
SODIUM: 141 meq/L (ref 135–145)

## 2018-01-05 ENCOUNTER — Encounter: Payer: Self-pay | Admitting: Family Medicine

## 2018-01-05 NOTE — Progress Notes (Signed)
Received his repeat K level- still slightly high Results for orders placed or performed in visit on 03/10/80  Basic metabolic panel  Result Value Ref Range   Sodium 141 135 - 145 mEq/L   Potassium 5.5 (H) 3.5 - 5.1 mEq/L   Chloride 105 96 - 112 mEq/L   CO2 27 19 - 32 mEq/L   Glucose, Bld 95 70 - 99 mg/dL   BUN 20 6 - 23 mg/dL   Creatinine, Ser 1.34 0.40 - 1.50 mg/dL   Calcium 9.8 8.4 - 10.5 mg/dL   GFR 55.50 (L) >60.00 mL/min   . BP Readings from Last 3 Encounters:  12/15/17 139/90  06/23/17 126/82  12/19/16 128/74

## 2018-01-06 ENCOUNTER — Other Ambulatory Visit: Payer: Self-pay | Admitting: Family Medicine

## 2018-01-06 DIAGNOSIS — E785 Hyperlipidemia, unspecified: Secondary | ICD-10-CM

## 2018-01-09 ENCOUNTER — Other Ambulatory Visit: Payer: Self-pay | Admitting: Family Medicine

## 2018-01-09 MED ORDER — AMLODIPINE BESYLATE 2.5 MG PO TABS: 2.5000 mg | ORAL_TABLET | Freq: Every day | ORAL | 6 refills | Status: DC

## 2018-01-10 ENCOUNTER — Encounter: Payer: Self-pay | Admitting: Family Medicine

## 2018-02-20 ENCOUNTER — Encounter: Payer: Self-pay | Admitting: Family Medicine

## 2018-02-20 NOTE — Telephone Encounter (Signed)
Pt came in office wanting to have his lab done since pt had received message by provider indicating needing potassium check but there is no lab orders on file can provider put order so pt can have labs done and to call pt when order is ready so pt can schedule appt for labs. Please call pt at [phone].

## 2018-03-12 ENCOUNTER — Other Ambulatory Visit: Payer: Self-pay | Admitting: Family Medicine

## 2018-03-12 DIAGNOSIS — F5101 Primary insomnia: Secondary | ICD-10-CM

## 2018-03-12 NOTE — Telephone Encounter (Signed)
Requesting: traZODone (DESYREL) 50 MG tablet  Contract UDS Last OV: 12/15/17 Last Refill: 06/19/17  Please Advise

## 2018-03-12 NOTE — Telephone Encounter (Deleted)
Requesting: traZODone (DESYREL) 50 MG tablet  Contract UDS Last OV Last Refill  Please Advise

## 2018-06-05 ENCOUNTER — Other Ambulatory Visit: Payer: Self-pay | Admitting: Family Medicine

## 2018-06-05 DIAGNOSIS — F5101 Primary insomnia: Secondary | ICD-10-CM

## 2018-06-09 ENCOUNTER — Other Ambulatory Visit: Payer: Self-pay | Admitting: Family Medicine

## 2018-06-14 NOTE — Progress Notes (Signed)
[facility] [address] [place], [place] [address] [phone]  Date:  06/15/2018   Name:  Shawn Santos   DOB:  1946/07/26   MRN:  [id number]  PCP:  Darreld Mclean, MD    Chief Complaint: No chief complaint on file.   History of Present Illness:  Shawn Santos is a 72 y.o. very pleasant male patient who presents with the following:  Here today for a CPE History of hyperlipidemia, celiac disease, HTN, hypothyroidism, insomnia, elevated PSA, and nephrotic syndrome Last visit here in December:  He does see urology as above- he does not need a PSA today- at last visit he gave him a year follow-up and he will have his PSA done per urology He is feeling well today. States that he has no complaints, his energy level is great, he is playing a lot of softball and really enjoying it He is concerned that his right ear may be blocked with wax today  Shawn Santos changed his steroid to flovent- this is ok but he does think that his other steroid worked better perhaps. He sees him in September and will discuss further His asthma has been under control Shawn Santos has had zostavax but would like to get shingrix- will have to do later as we do not have enough supply currently  Pulse on recount approx 100 BPM- Shawn Santos does tend to run on the high side with pulse consistently in the 90s. He feels fine today ?repeat pneumovax for him. He had this in 2008 but he was not yet 63 at that time- he was just shy of 53 He would like to update his pneumovax today  He is seeing urology yearly now and all is going well     BP Readings from Last 3 Encounters:  12/15/17 139/90  06/23/17 126/82  12/19/16 128/74   He is on lisinopril 5 mg- BP a bit high today, but was ok at last visit Will continue to monitor He is feeling well, is active and happy He is traveling to [place] lab notes: Your thyroid looks  fine- continue current dose of synthroid medication  Your potassium is slightly high- this may be due to blood processing (sometimes during handling blood cells break open and release potassium). However, high potassium can also be due to lisinopril, and I want to make sure this is not persistent  Please come in for a repeat BMP (lab visit only) before you leave for [place] if you can- if not can do when you return. You can eat normally for the test, and I would encourage you to also be well hydrated.   Otherwise we can plan to visit in 6 months   Lab Results  Component Value Date   TSH 1.50 12/15/2017   He is a pt of  asthma and allergy and saw them in May - he is just on Symbicort  Seen in 4/19 by Dr. Diona Fanti who gave him a year follow-up for his prostate concerns We ended up changing from lisionpril to amlodipine for his BP- he is tolerating this well, he does not really check his BP at home but does have a cuff  In February he was able to celebrate the 90th B-day of a great friend.   He is fasting today   He has noted some pain in his left big toe with walking- he has noted  this for about a year. It does not seem to be getting better or worse.  Present mostly when he is walking  He did have gout in the past -  Wonders if he might have a med to treat this in case of attack later this summer He will use aleve as needed for his toe pain This pain is not really a big deal to him, he was not sure if he should mention it  He is taking trazodone  at night and doing well with this, sleeping well His mood is good   BP Readings from Last 3 Encounters:  06/15/18 (!) 144/76  12/15/17 139/90  06/23/17 126/82    States no other concerns today  Patient Active Problem List   Diagnosis Date Noted  . Mixed hyperlipidemia 12/05/2014  . Bloating 09/16/2014  . Proteinuria 05/30/2014  . Eosinophilia 02/01/2013  . Elevated PSA 02/01/2013  . HTN (hypertension) 07/27/2012  . Insomnia  07/27/2012  . Hypothyroid 07/27/2012  . ANEMIA, IRON DEFICIENCY 04/18/2008  . CELIAC SPRUE 04/18/2008  . DUODENITIS, WITHOUT HEMORRHAGE 06/02/2007    Past Medical History:  Diagnosis Date  . Allergy   . Asthma   . Celiac disease   . Chronic kidney disease    nephrotic syndrome  . Hyperlipidemia   . Hypertension   . Insomnia   . PSA elevation   . Thyroid disease     Past Surgical History:  Procedure Laterality Date  . COLONOSCOPY    . EYE SURGERY     cataract- both eyes  . UPPER GASTROINTESTINAL ENDOSCOPY      Social History   Tobacco Use  . Smoking status: Never Smoker  . Smokeless tobacco: Never Used  Substance Use Topics  . Alcohol use: No    Comment: RARELY BEER  . Drug use: No    Family History  Problem Relation Age of Onset  . Alcohol abuse Mother   . COPD Father   . Diabetes Son   . Colon cancer Neg Hx   . Esophageal cancer Neg Hx   . Rectal cancer Neg Hx   . Stomach cancer Neg Hx     No Known Allergies  Medication list has been reviewed and updated.  Current Outpatient Medications on File Prior to Visit  Medication Sig Dispense Refill  . aspirin 81 MG tablet Take 81 mg by mouth daily. Reported on 06/19/2016    . fluticasone (FLONASE) 50 MCG/ACT nasal spray Place 2 sprays into both nostrils daily. 16 g 6  . levocetirizine (XYZAL) 5 MG tablet Take 1 tablet by mouth daily.  4  . levothyroxine (SYNTHROID, LEVOTHROID) 75 MCG tablet TAKE 1 TABLET BY MOUTH EVERY DAY BEFORE BREAKFAST 90 tablet 3  . SYMBICORT 160-4.5 MCG/ACT inhaler Inhale 2 puffs into the lungs 2 (two) times daily.  3  . traZODone (DESYREL) 50 MG tablet TAKE 1/2-1 TABLET (25-50 MG TOTAL) BY MOUTH AT BEDTIME AS NEEDED FOR SLEEP. 90 tablet 0  . PROAIR HFA 108 (90 BASE) MCG/ACT inhaler INHALE 2 PUFFS INTO THE LUNGS EVERY 6 HOURS AS NEEDED FOR SHORTNESS OF BREATH OR WHEEZING (Patient not taking: Reported on 06/15/2018) 8.5 Inhaler 3   No current facility-administered medications on file prior  to visit.     Review of Systems:  As per HPI- otherwise negative. No CP or SOB with exercise Able to pass urine ok   Physical Examination: Vitals:   06/15/18 1111  BP: (!) 144/76  Pulse: (!) 104  Resp: 16  Temp: 97.8 F (36.6 C)  SpO2: 97%   Vitals:   06/15/18 1111  Weight: 159 lb (72.1 kg)  Height: 5' 5.5" (1.664 m)   Body mass index is 26.06 kg/m. Ideal Body Weight: Weight in (lb) to have BMI = 25: 152.2  GEN: WDWN, NAD, Non-toxic, A & O x 3, looks well  HEENT: Atraumatic, Normocephalic. Neck supple. No masses, No LAD.  Bilateral TM wnl, oropharynx normal.  PEERL,EOMI.   Ears and Nose: No external deformity. CV: RRR, No M/G/R. No JVD. No thrill. No extra heart sounds. PULM: CTA B, no wheezes, crackles, rhonchi. No retractions. No resp. distress. No accessory muscle use. ABD: S, NT, ND, +BS. No rebound. No HSM. EXTR: No c/c/e NEURO Normal gait.  PSYCH: Normally interactive. Conversant. Not depressed or anxious appearing.  Calm demeanor.  Left foot/ great toe normal on exam, non tender today. He declines an x-ray today    Assessment and Plan: Physical exam  Decreased GFR - Plan: Comprehensive metabolic panel, Microalbumin / creatinine urine ratio  Hypothyroidism due to acquired atrophy of thyroid - Plan: TSH  Hypertension, unspecified type - Plan: CBC, amLODipine (NORVASC) 2.5 MG tablet  Mixed hyperlipidemia - Plan: Lipid panel, atorvastatin (LIPITOR) 20 MG tablet  Screening for deficiency anemia - Plan: CBC  History of proteinuria syndrome - Plan: Microalbumin / creatinine urine ratio  Dyslipidemia - Plan: atorvastatin (LIPITOR) 20 MG tablet  Idiopathic gout involving toe, unspecified chronicity, unspecified laterality - Plan: colchicine 0.6 MG tablet  CPE today Labs pending as above BP is borderline today- he will try to monitor this at home  Will plan further follow- up pending labs. He will let me know if increased toe pain Gave rx for colchcine to  have in case of occasional gout   Signed Lamar Blinks, MD

## 2018-06-15 ENCOUNTER — Ambulatory Visit (INDEPENDENT_AMBULATORY_CARE_PROVIDER_SITE_OTHER): Payer: Medicare Other | Admitting: Family Medicine

## 2018-06-15 ENCOUNTER — Encounter: Payer: Self-pay | Admitting: Family Medicine

## 2018-06-15 VITALS — BP 144/76 | HR 104 | Temp 97.8°F | Resp 16 | Ht 65.5 in | Wt 159.0 lb

## 2018-06-15 DIAGNOSIS — M10079 Idiopathic gout, unspecified ankle and foot: Secondary | ICD-10-CM | POA: Diagnosis not present

## 2018-06-15 DIAGNOSIS — E034 Atrophy of thyroid (acquired): Secondary | ICD-10-CM

## 2018-06-15 DIAGNOSIS — E785 Hyperlipidemia, unspecified: Secondary | ICD-10-CM | POA: Diagnosis not present

## 2018-06-15 DIAGNOSIS — Z87448 Personal history of other diseases of urinary system: Secondary | ICD-10-CM

## 2018-06-15 DIAGNOSIS — I10 Essential (primary) hypertension: Secondary | ICD-10-CM | POA: Diagnosis not present

## 2018-06-15 DIAGNOSIS — E782 Mixed hyperlipidemia: Secondary | ICD-10-CM | POA: Diagnosis not present

## 2018-06-15 DIAGNOSIS — R944 Abnormal results of kidney function studies: Secondary | ICD-10-CM | POA: Diagnosis not present

## 2018-06-15 DIAGNOSIS — Z130 Encounter for screening for diseases of the blood and blood-forming organs and certain disorders involving the immune mechanism: Secondary | ICD-10-CM

## 2018-06-15 DIAGNOSIS — Z0000 Encounter for general adult medical examination without abnormal findings: Secondary | ICD-10-CM | POA: Diagnosis not present

## 2018-06-15 LAB — COMPREHENSIVE METABOLIC PANEL
ALT: 22 U/L (ref 0–53)
AST: 21 U/L (ref 0–37)
Albumin: 4.5 g/dL (ref 3.5–5.2)
Alkaline Phosphatase: 77 U/L (ref 39–117)
BUN: 16 mg/dL (ref 6–23)
CALCIUM: 9.4 mg/dL (ref 8.4–10.5)
CHLORIDE: 105 meq/L (ref 96–112)
CO2: 26 meq/L (ref 19–32)
CREATININE: 1.14 mg/dL (ref 0.40–1.50)
GFR: 66.8 mL/min (ref >60.00)
Glucose, Bld: 96 mg/dL (ref 70–99)
POTASSIUM: 4.5 meq/L (ref 3.5–5.1)
Sodium: 143 mEq/L (ref 135–145)
Total Bilirubin: 0.6 mg/dL (ref 0.2–1.2)
Total Protein: 7 g/dL (ref 6.0–8.3)

## 2018-06-15 LAB — TSH: TSH: 2.82 u[IU]/mL (ref 0.35–4.50)

## 2018-06-15 LAB — CBC
HCT: 44.8 % (ref 39.0–52.0)
Hemoglobin: 14.9 g/dL (ref 13.0–17.0)
MCHC: 33.2 g/dL (ref 30.0–36.0)
MCV: 91.7 fl (ref 78.0–100.0)
PLATELETS: 252 10*3/uL (ref 150.0–400.0)
RBC: 4.88 Mil/uL (ref 4.22–5.81)
RDW: 13.8 % (ref 11.5–15.5)
WBC: 10.2 10*3/uL (ref 4.0–10.5)

## 2018-06-15 LAB — LIPID PANEL
CHOL/HDL RATIO: 3
Cholesterol: 156 mg/dL (ref 0–200)
HDL: 47.1 mg/dL (ref >39.00)
LDL CALC: 86 mg/dL (ref 0–99)
NonHDL: 108.7
TRIGLYCERIDES: 115 mg/dL (ref 0.0–149.0)
VLDL: 23 mg/dL (ref 0.0–40.0)

## 2018-06-15 MED ORDER — COLCHICINE 0.6 MG PO TABS: ORAL_TABLET | ORAL | 1 refills | Status: DC

## 2018-06-15 MED ORDER — ATORVASTATIN CALCIUM 20 MG PO TABS: ORAL_TABLET | ORAL | 3 refills | Status: DC

## 2018-06-15 MED ORDER — AMLODIPINE BESYLATE 2.5 MG PO TABS: 2.5000 mg | ORAL_TABLET | Freq: Every day | ORAL | 3 refills | Status: DC

## 2018-06-15 NOTE — Patient Instructions (Addendum)
Good to see you today! Have a great summer I will be in touch with your labs asap Please do monitor your BP at home; perhaps check it once a week. If you tend to run higher than 140/90 please alert me  Let me know if your toe is bothing your more. I gave you an rx for colchicine to have on hand; you can take in case of a gout attack.   You would take 2 pills at once, than one more an hour later- no more until next gout attack   Let's plan to visit in 6 months assuming labs are ok    Health Maintenance, Male A healthy lifestyle and preventive care is important for your health and wellness. Ask your health care provider about what schedule of regular examinations is right for you. What should I know about weight and diet? Eat a Healthy Diet  Eat plenty of vegetables, fruits, whole grains, low-fat dairy products, and lean protein.  Do not eat a lot of foods high in solid fats, added sugars, or salt.  Maintain a Healthy Weight Regular exercise can help you achieve or maintain a healthy weight. You should:  Do at least 150 minutes of exercise each week. The exercise should increase your heart rate and make you sweat (moderate-intensity exercise).  Do strength-training exercises at least twice a week.  Watch Your Levels of Cholesterol and Blood Lipids  Have your blood tested for lipids and cholesterol every 5 years starting at 74 years of age. If you are at high risk for heart disease, you should start having your blood tested when you are 74 years old. You may need to have your cholesterol levels checked more often if: ? Your lipid or cholesterol levels are high. ? You are older than 74 years of age. ? You are at high risk for heart disease.  What should I know about cancer screening? Many types of cancers can be detected early and may often be prevented. Lung Cancer  You should be screened every year for lung cancer if: ? You are a current smoker who has smoked for at least 30  years. ? You are a former smoker who has quit within the past 15 years.  Talk to your health care provider about your screening options, when you should start screening, and how often you should be screened.  Colorectal Cancer  Routine colorectal cancer screening usually begins at 74 years of age and should be repeated every 5-10 years until you are 74 years old. You may need to be screened more often if early forms of precancerous polyps or small growths are found. Your health care provider may recommend screening at an earlier age if you have risk factors for colon cancer.  Your health care provider may recommend using home test kits to check for hidden blood in the stool.  A small camera at the end of a tube can be used to examine your colon (sigmoidoscopy or colonoscopy). This checks for the earliest forms of colorectal cancer.  Prostate and Testicular Cancer  Depending on your age and overall health, your health care provider may do certain tests to screen for prostate and testicular cancer.  Talk to your health care provider about any symptoms or concerns you have about testicular or prostate cancer.  Skin Cancer  Check your skin from head to toe regularly.  Tell your health care provider about any new moles or changes in moles, especially if: ? There is a change  in a mole's size, shape, or color. ? You have a mole that is larger than a pencil eraser.  Always use sunscreen. Apply sunscreen liberally and repeat throughout the day.  Protect yourself by wearing long sleeves, pants, a wide-brimmed hat, and sunglasses when outside.  What should I know about heart disease, diabetes, and high blood pressure?  If you are 41-86 years of age, have your blood pressure checked every 3-5 years. If you are 34 years of age or older, have your blood pressure checked every year. You should have your blood pressure measured twice-once when you are at a hospital or clinic, and once when you are  not at a hospital or clinic. Record the average of the two measurements. To check your blood pressure when you are not at a hospital or clinic, you can use: ? An automated blood pressure machine at a pharmacy. ? A home blood pressure monitor.  Talk to your health care provider about your target blood pressure.  If you are between 68-91 years old, ask your health care provider if you should take aspirin to prevent heart disease.  Have regular diabetes screenings by checking your fasting blood sugar level. ? If you are at a normal weight and have a low risk for diabetes, have this test once every three years after the age of 4. ? If you are overweight and have a high risk for diabetes, consider being tested at a younger age or more often.  A one-time screening for abdominal aortic aneurysm (AAA) by ultrasound is recommended for men aged 22-75 years who are current or former smokers. What should I know about preventing infection? Hepatitis B If you have a higher risk for hepatitis B, you should be screened for this virus. Talk with your health care provider to find out if you are at risk for hepatitis B infection. Hepatitis C Blood testing is recommended for:  Everyone born from [birth year] through 1967.  Anyone with known risk factors for hepatitis C.  Sexually Transmitted Diseases (STDs)  You should be screened each year for STDs including gonorrhea and chlamydia if: ? You are sexually active and are younger than 74 years of age. ? You are older than 74 years of age and your health care provider tells you that you are at risk for this type of infection. ? Your sexual activity has changed since you were last screened and you are at an increased risk for chlamydia or gonorrhea. Ask your health care provider if you are at risk.  Talk with your health care provider about whether you are at high risk of being infected with HIV. Your health care provider may recommend a prescription medicine to help  prevent HIV infection.  What else can I do?  Schedule regular health, dental, and eye exams.  Stay current with your vaccines (immunizations).  Do not use any tobacco products, such as cigarettes, chewing tobacco, and e-cigarettes. If you need help quitting, ask your health care provider.  Limit alcohol intake to no more than 2 drinks per day. One drink equals 12 ounces of beer, 5 ounces of wine, or 1 ounces of hard liquor.  Do not use street drugs.  Do not share needles.  Ask your health care provider for help if you need support or information about quitting drugs.  Tell your health care provider if you often feel depressed.  Tell your health care provider if you have ever been abused or do not feel safe at home. This  information is not intended to replace advice given to you by your health care provider. Make sure you discuss any questions you have with your health care provider. Document Released: 06/13/2008 Document Revised: 08/14/2016 Document Reviewed: 09/19/2015 Elsevier Interactive Patient Education  [employer].

## 2018-06-16 ENCOUNTER — Encounter: Payer: Self-pay | Admitting: Family Medicine

## 2018-06-16 LAB — MICROALBUMIN / CREATININE URINE RATIO
Creatinine,U: 234.2 mg/dL
MICROALB UR: 79.8 mg/dL — AB (ref 0.0–1.9)
Microalb Creat Ratio: 34.1 mg/g — ABNORMAL HIGH (ref 0.0–30.0)

## 2018-06-16 NOTE — Telephone Encounter (Signed)
Spoke w/ Pt- BP after resting was 140s/70s- instructed I'd let PCP know of BPs. Pt thanked me for calling to check on him.

## 2018-06-17 ENCOUNTER — Encounter: Payer: Self-pay | Admitting: Family Medicine

## 2018-06-20 ENCOUNTER — Encounter: Payer: Self-pay | Admitting: Family Medicine

## 2018-06-21 ENCOUNTER — Encounter: Payer: Self-pay | Admitting: Family Medicine

## 2018-07-02 ENCOUNTER — Other Ambulatory Visit: Payer: Self-pay | Admitting: Family Medicine

## 2018-07-02 DIAGNOSIS — E038 Other specified hypothyroidism: Secondary | ICD-10-CM

## 2018-07-05 ENCOUNTER — Encounter: Payer: Self-pay | Admitting: Family Medicine

## 2018-07-31 ENCOUNTER — Ambulatory Visit: Payer: Medicare Other | Admitting: Family Medicine

## 2018-07-31 ENCOUNTER — Encounter: Payer: Self-pay | Admitting: Family Medicine

## 2018-07-31 VITALS — BP 136/88 | HR 108 | Temp 98.3°F | Ht 65.5 in | Wt 161.2 lb

## 2018-07-31 DIAGNOSIS — M25571 Pain in right ankle and joints of right foot: Secondary | ICD-10-CM

## 2018-07-31 NOTE — Progress Notes (Signed)
Musculoskeletal Exam  Patient: Shawn Santos DOB: 10-17-1946  DOS: 07/31/2018  SUBJECTIVE:  Chief Complaint:   Chief Complaint  Patient presents with  . Ankle Pain    right    Shawn Santos is a 72 y.o.  male for evaluation and treatment of R ankle pain.   Onset:  4 days ago. No inj or change in activity.  Location: R ankle Character:  aching/discomfort when walking only Progression of issue:  has worsened slightly Associated symptoms: Swelling Treatment: to date has been rest and acetaminophen. Took colchicine for a gout flare yesterday but it did not help.  Neurovascular symptoms: no  ROS: Musculoskeletal/Extremities: +R ankle pain  Past Medical History:  Diagnosis Date  . Allergy   . Asthma   . Celiac disease   . Chronic kidney disease    nephrotic syndrome  . Hyperlipidemia   . Hypertension   . Insomnia   . PSA elevation   . Thyroid disease     Objective: VITAL SIGNS: BP 136/88 (BP Location: Left Arm, Patient Position: Sitting, Cuff Size: Normal)   Pulse (!) 108   Temp 98.3 F (36.8 C) (Oral)   Ht 5' 5.5" (1.664 m)   Wt 161 lb 4 oz (73.1 kg)   SpO2 96%   BMI 26.43 kg/m  Constitutional: Well formed, well developed. No acute distress. Cardiovascular: Brisk cap refill Thorax & Lungs: No accessory muscle use Musculoskeletal: R ankle.   Normal active range of motion: yes.   Normal passive range of motion: yes Tenderness to palpation: mild ttp over ant/lat ankle Deformity: no Ecchymosis: no Tests positive: none Tests negative: Ant drawer, squeeze Neurologic: Normal sensory function. No focal deficits noted.  Psychiatric: Normal mood. Age appropriate judgment and insight. Alert & oriented x 3.    Assessment:  Acute right ankle pain  Plan: Tylenol, stretches/exercises, ice, elevation, brace. OK to take gout medicine today and tomorrow to see if it calms things down.  F/u prn. The patient voiced understanding and agreement to the  plan.   [place], DO 07/31/18  9:23 AM

## 2018-07-31 NOTE — Patient Instructions (Signed)
OK to take Tylenol 1000 mg (2 extra strength tabs) or 975 mg (3 regular strength tabs) every 6 hours as needed.  Ice/cold pack over area for 10-15 min twice daily.  Compress the area at home.  Ankle Exercises It is normal to feel mild stretching, pulling, tightness, or discomfort as you do these exercises, but you should stop right away if you feel sudden pain or your pain gets worse.  Stretching and range of motion exercises These exercises warm up your muscles and joints and improve the movement and flexibility of your ankle. These exercises also help to relieve pain, numbness, and tingling. Exercise A: Dorsiflexion/Plantar Flexion   1. Sit with your affected knee straight or bent. Do not rest your foot on anything. 2. Flex your affected ankle to tilt the top of your foot toward your shin. 3. Hold this position for 5 seconds. 4. Point your toes downward to tilt the top of your foot away from your shin. 5. Hold this position for 5 seconds. Repeat 2 times. Complete this exercise 3 times per week. Exercise B: Ankle Alphabet   1. Sit with your affected foot supported at your lower leg. ? Do not rest your foot on anything. ? Make sure your foot has room to move freely. 2. Think of your affected foot as a paintbrush, and move your foot to trace each letter of the alphabet in the air. Keep your hip and knee still while you trace. Make the letters as large as you can without increasing any discomfort. 3. Trace every letter from A to Z. Repeat 2 times. Complete this exercise 3 times per week. Strengthening exercises These exercises build strength and endurance in your ankle. Endurance is the ability to use your muscles for a long time, even after they get tired. Exercise D: Dorsiflexors   1. Secure a rubber exercise band or tube to an object, such as a table leg, that will stay still when the band is pulled. Secure the other end around your affected foot. 2. Sit on the floor, facing the  object with your affected leg extended. The band or tube should be slightly tense when your foot is relaxed. 3. Slowly flex your affected ankle and toes to bring your foot toward you. 4. Hold this position for 3 seconds.  5. Slowly return your foot to the starting position, controlling the band as you do that. Do a total of 10 repetitions. Repeat 2 times. Complete this exercise 3 times per week. Exercise E: Plantar Flexors   1. Sit on the floor with your affected leg extended. 2. Loop a rubber exercise band or tube around the ball of your affected foot. The ball of your foot is on the walking surface, right under your toes. The band or tube should be slightly tense when your foot is relaxed. 3. Slowly point your toes downward, pushing them away from you. 4. Hold this position for 3 seconds. 5. Slowly release the tension in the band or tube, controlling smoothly until your foot is back in the starting position. Repeat for a total of 10 repetitions. Repeat 2 times. Complete this exercise 3 times per week. Exercise F: Towel Curls   1. Sit in a chair on a non-carpeted surface, and put your feet on the floor. 2. Place a towel in front of your feet.  3. Keeping your heel on the floor, put your affected foot on the towel. 4. Pull the towel toward you by grabbing the towel with your  toes and curling them under. Keep your heel on the floor. 5. Let your toes relax. 6. Grab the towel again. Keep going until the towel is completely underneath your foot. Repeat for a total of 10 repetitions. Repeat 2 times. Complete this exercise 3 times per week. Exercise G: Heel Raise ( Plantar Flexors, Standing)    1. Stand with your feet shoulder-width apart. 2. Keep your weight spread evenly over the width of your feet while you rise up on your toes. Use a wall or table to steady yourself, but try not to use it for support. 3. If this exercise is too easy, try these options: ? Shift your weight toward your  affected leg until you feel challenged. ? If told by your health care provider, lift your uninjured leg off the floor. 4. Hold this position for 3 seconds. Repeat for a total of 10 repetitions. Repeat 2 times. Complete this exercise 3 times per week. Exercise H: Tandem Walking 1. Stand with one foot directly in front of the other. 2. Slowly raise your back foot up, lifting your heel before your toes, and place it directly in front of your other foot. 3. Continue to walk in this heel-to-toe way for 10 steps or for as long as told by your health care provider. Have a countertop or wall nearby to use if needed to keep your balance, but try not to hold onto anything for support. Repeat 2 times. Complete this exercises 3 times per week. Make sure you discuss any questions you have with your health care provider. Document Released: 10/30/2005 Document Revised: 08/15/2016 Document Reviewed: 09/03/2015 Elsevier Interactive Patient Education  2018 [employer].

## 2018-07-31 NOTE — Progress Notes (Signed)
Pre visit review using our clinic review tool, if applicable. No additional management support is needed unless otherwise documented below in the visit note. 

## 2018-08-02 ENCOUNTER — Encounter: Payer: Self-pay | Admitting: Family Medicine

## 2018-08-04 NOTE — Telephone Encounter (Signed)
BP Readings from Last 3 Encounters:  07/31/18 136/88  06/15/18 (!) 144/76  12/15/17 139/90

## 2018-09-02 ENCOUNTER — Other Ambulatory Visit: Payer: Self-pay | Admitting: Family Medicine

## 2018-09-02 DIAGNOSIS — F5101 Primary insomnia: Secondary | ICD-10-CM

## 2018-12-01 ENCOUNTER — Encounter: Payer: Self-pay | Admitting: Family Medicine

## 2018-12-13 NOTE — Progress Notes (Signed)
[place] at [facility] [address], [place], [place] [address] [phone]  Date:  12/16/2018   Name:  Shawn Santos   DOB:  Dec 15, 1946   MRN:  [id number]  PCP:  Darreld Mclean, MD    Chief Complaint: Hyperlipidemia (6 month follow up, flu shot) and Hypertension   History of Present Illness:  Shawn Santos is a 72 y.o. very pleasant male patient who presents with the following:  History of HTN, hyperlipidemia, celiac, past episodes of nephrotic syndrome, hypothyroidism He does see urology, [place], for PSA management He was seen in August with an ankle issue, and we have emailed about his BP otherwise I last saw him in June at which time we did routine labs for him  BP Readings from Last 3 Encounters:  12/16/18 130/80  07/31/18 136/88  06/15/18 (!) 144/76   Flu shot is done  Labs done 6 months ago, looked great.  He does have protein in urine  He saw nephrology back in 2015, and they just wanted to see him PRN  He is a retired Licensed conveyancer Widowed ?shingrix- he is interested in getting this, but will need to be done at the pharmacy.  He is apprenhesive about getting the shot at a retail store, but I advised him he can also be done here at the med center pharmacy.  Harold Hedge cut the dose of his inhaler- he is not sure if this is working as well as the higher dose.  He will follow-up with Dr. Fredderick Phenix about this concern. He is checking his BP at home- it does tend to run about 145/85+  He is taking 2.5 mg amlodipine currently.  He is happy to try increasing this to 5 mg, and will send me a message with blood pressure readings in a couple of weeks.  [name] also has noted symptoms of left elbow pain for about 1 month.  He is not aware of any injury His description of location of pain seems consistent with lateral epicondylitis   Finally, [name] notes some concern by his hearing.  He notes that he is not able to hear some  voices as well as others.  He oftentimes needs asked people to repeat what they just said.  I advised him to have an audiology evaluation, he plans to do so  Patient Active Problem List   Diagnosis Date Noted  . Mixed hyperlipidemia 12/05/2014  . Bloating 09/16/2014  . Proteinuria 05/30/2014  . Eosinophilia 02/01/2013  . Elevated PSA 02/01/2013  . HTN (hypertension) 07/27/2012  . Insomnia 07/27/2012  . Hypothyroid 07/27/2012  . ANEMIA, IRON DEFICIENCY 04/18/2008  . CELIAC SPRUE 04/18/2008  . DUODENITIS, WITHOUT HEMORRHAGE 06/02/2007    Past Medical History:  Diagnosis Date  . Allergy   . Asthma   . Celiac disease   . Chronic kidney disease    nephrotic syndrome  . Hyperlipidemia   . Hypertension   . Insomnia   . PSA elevation   . Thyroid disease     Past Surgical History:  Procedure Laterality Date  . COLONOSCOPY    . EYE SURGERY     cataract- both eyes  . UPPER GASTROINTESTINAL ENDOSCOPY      Social History   Tobacco Use  . Smoking status: Never Smoker  . Smokeless tobacco: Never Used  Substance Use Topics  . Alcohol use: No    Comment: RARELY BEER  . Drug use: No  Family History  Problem Relation Age of Onset  . Alcohol abuse Mother   . COPD Father   . Diabetes Son   . Colon cancer Neg Hx   . Esophageal cancer Neg Hx   . Rectal cancer Neg Hx   . Stomach cancer Neg Hx     No Known Allergies  Medication list has been reviewed and updated.  Current Outpatient Medications on File Prior to Visit  Medication Sig Dispense Refill  . amLODipine (NORVASC) 2.5 MG tablet Take 1 tablet (2.5 mg total) by mouth daily. 90 tablet 3  . aspirin 81 MG tablet Take 81 mg by mouth daily. Reported on 06/19/2016    . atorvastatin (LIPITOR) 20 MG tablet TAKE 1 TABLET (20 MG TOTAL) BY MOUTH DAILY. 90 tablet 3  . budesonide-formoterol (SYMBICORT) 80-4.5 MCG/ACT inhaler Inhale 2 puffs into the lungs 2 (two) times daily.    . colchicine 0.6 MG tablet Take 2 pills for gout  attack, then 1 pill an hour later 15 tablet 1  . fluticasone (FLONASE) 50 MCG/ACT nasal spray Place 2 sprays into both nostrils daily. 16 g 6  . levocetirizine (XYZAL) 5 MG tablet Take 1 tablet by mouth daily.  4  . levothyroxine (SYNTHROID, LEVOTHROID) 75 MCG tablet TAKE 1 TABLET BY MOUTH EVERY DAY BEFORE BREAKFAST 90 tablet 1  . traZODone (DESYREL) 50 MG tablet TAKE 1/2-1 TABLET (25-50 MG TOTAL) BY MOUTH AT BEDTIME AS NEEDED FOR SLEEP. 90 tablet 1   No current facility-administered medications on file prior to visit.     Review of Systems:  As per HPI- otherwise negative.   Physical Examination: Vitals:   12/16/18 1114  BP: 130/80  Pulse: (!) 104  Resp: 16  Temp: 98.1 F (36.7 C)  SpO2: 97%   Vitals:   12/16/18 1114  Weight: 164 lb (74.4 kg)  Height: 5' 5.5" (1.664 m)   Body mass index is 26.88 kg/m. Ideal Body Weight: Weight in (lb) to have BMI = 25: 152.2  GEN: WDWN, NAD, Non-toxic, A & O x 3, normal weight, looks his normal self. HEENT: Atraumatic, Normocephalic. Neck supple. No masses, No LAD. Ears and Nose: No external deformity. CV: RRR, No M/G/R. No JVD. No thrill. No extra heart sounds. PULM: CTA B, no wheezes, crackles, rhonchi. No retractions. No resp. distress. No accessory muscle use. ABD: S, NT, ND, +BS. No rebound. No HSM. EXTR: No c/c/e NEURO Normal gait.  PSYCH: Normally interactive. Conversant. Not depressed or anxious appearing.  Calm demeanor.  Left elbow: He notes tenderness over the left lateral epicondyle.  Elbow with normal range of motion.  He has some tenderness with resisted supination   Assessment and Plan: Essential hypertension  Need for influenza vaccination - Plan: Flu vaccine HIGH DOSE PF (Fluzone High dose)  Bilateral hearing loss, unspecified hearing loss type  Lateral epicondylitis of left elbow  Follow-up visit today.  Labs were done 6 months ago and were normal, we plan to do labs at our next visit.  Flu shot given today.   Given written prescription for Shingrix, which he plans to do at her drugstore here. Advised him to have a hearing evaluation per audiology, which she plans to do. Discussed conservative therapy for lateral epicondylitis.  If not improved soon, we will send him to orthopedics. Blood pressure looks okay here, but [name] reports it is often elevated slightly at home.  He will increase his amlodipine to 5 mg, and send me a message with updated readings  in 1 to 2 weeks.  If he responds well to 5 mg I will change his prescription  Signed Lamar Blinks, MD

## 2018-12-16 ENCOUNTER — Encounter: Payer: Self-pay | Admitting: Family Medicine

## 2018-12-16 ENCOUNTER — Other Ambulatory Visit: Payer: Self-pay | Admitting: Family Medicine

## 2018-12-16 ENCOUNTER — Ambulatory Visit: Payer: Medicare Other | Admitting: Family Medicine

## 2018-12-16 VITALS — BP 130/80 | HR 104 | Temp 98.1°F | Resp 16 | Ht 65.5 in | Wt 164.0 lb

## 2018-12-16 DIAGNOSIS — I10 Essential (primary) hypertension: Secondary | ICD-10-CM

## 2018-12-16 DIAGNOSIS — H9193 Unspecified hearing loss, bilateral: Secondary | ICD-10-CM

## 2018-12-16 DIAGNOSIS — E038 Other specified hypothyroidism: Secondary | ICD-10-CM

## 2018-12-16 DIAGNOSIS — M7712 Lateral epicondylitis, left elbow: Secondary | ICD-10-CM

## 2018-12-16 DIAGNOSIS — Z23 Encounter for immunization: Secondary | ICD-10-CM

## 2018-12-16 MED ORDER — ZOSTER VAC RECOMB ADJUVANTED 50 MCG/0.5ML IM SUSR: 0.5000 mL | Freq: Once | INTRAMUSCULAR | 0 refills | Status: AC

## 2018-12-16 MED ORDER — ZOSTER VAC RECOMB ADJUVANTED 50 MCG/0.5ML IM SUSR: 0.5000 mL | Freq: Once | INTRAMUSCULAR | 0 refills | Status: DC

## 2018-12-16 MED FILL — SHINGRIX 50 MCG SUS: 50 | 1 days supply | Qty: 1 | Fill #0

## 2018-12-16 NOTE — Patient Instructions (Addendum)
I am going to give you an rx for the shingles vaccine.  This is a 2 shot series, you have 1 shot and then the second shot in 2 - 6 months.  This can be given at most major pharmacies. You can have this done at your convenience, it is not an emergency.  Please visit the audiologist of your choice for hearing evaluation.  I oftentimes use [facility]. I think you have lateral epicondylitis, otherwise known as tennis elbow on the left.  A tennis elbow strap, ice, and avoidance of painful maneuvers will oftentimes clear this up.  However if not better in a couple of months please let me know.  Try increasing your amlodipine to 5 mg.  This would be 2 of the 2.5 mg pills you have on hand.  Please send me a message in a week or so with an update, and if this is going well I will change you prescription to 5 mg

## 2019-01-03 ENCOUNTER — Encounter: Payer: Self-pay | Admitting: Family Medicine

## 2019-01-03 DIAGNOSIS — I10 Essential (primary) hypertension: Secondary | ICD-10-CM

## 2019-01-04 MED ORDER — AMLODIPINE BESYLATE 5 MG PO TABS: 5.0000 mg | ORAL_TABLET | Freq: Every day | ORAL | 3 refills | Status: DC

## 2019-02-05 ENCOUNTER — Other Ambulatory Visit: Payer: Self-pay | Admitting: Family Medicine

## 2019-02-05 DIAGNOSIS — I10 Essential (primary) hypertension: Secondary | ICD-10-CM

## 2019-03-02 ENCOUNTER — Encounter: Payer: Self-pay | Admitting: Family Medicine

## 2019-03-02 DIAGNOSIS — F5101 Primary insomnia: Secondary | ICD-10-CM

## 2019-03-02 MED ORDER — TRAZODONE HCL 50 MG PO TABS: ORAL_TABLET | ORAL | 3 refills | Status: DC

## 2019-03-11 ENCOUNTER — Encounter: Payer: Self-pay | Admitting: Family Medicine

## 2019-04-14 ENCOUNTER — Encounter: Payer: Self-pay | Admitting: Family Medicine

## 2019-06-08 ENCOUNTER — Other Ambulatory Visit: Payer: Self-pay | Admitting: Family Medicine

## 2019-06-08 DIAGNOSIS — E782 Mixed hyperlipidemia: Secondary | ICD-10-CM

## 2019-06-08 DIAGNOSIS — E038 Other specified hypothyroidism: Secondary | ICD-10-CM

## 2019-06-08 DIAGNOSIS — E785 Hyperlipidemia, unspecified: Secondary | ICD-10-CM

## 2019-06-17 ENCOUNTER — Other Ambulatory Visit: Payer: Self-pay

## 2019-06-17 ENCOUNTER — Ambulatory Visit: Payer: Medicare Other | Admitting: Family Medicine

## 2019-06-17 MED ORDER — SHINGRIX 50 MCG/0.5ML IM SUSR: 0.5000 mL | Freq: Once | INTRAMUSCULAR | 0 refills | Status: AC

## 2019-06-17 MED FILL — SHINGRIX 50 MCG SUS: 50 | 30 days supply | Qty: 1 | Fill #0

## 2019-06-23 NOTE — Progress Notes (Addendum)
[place] at [facility] [address] [place], [place] [address] [phone]  Date:  06/24/2019   Name:  Shawn Santos   DOB:  27-Feb-1946   MRN:  [id number]  PCP:  Darreld Mclean, MD    Chief Complaint: Hypertension (6 month follow up)   History of Present Illness:  Shawn Santos is a 73 y.o. very pleasant male patient who presents with the following:  In person follow-up visit today History of hyperlipidemia, HTN, hypothyroidism, celiac disease, prior episodes of nephrotic syndrome Dr. 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  No CP or SOB with exercise or otherwise  He is wearing a mask when he goes to stores  No sx from celiac disease - his BMs are normal   Last seen by myself in December  Shingrix:  We had discussed his getting this at pharmacy- he completed this at the med center Labs: a year ago, now due   lipitor Synthroid Amlodipine- he is taking 5 mg, no swelling of his LE  Asa Trazodone  He notes that his BP is generally mid 140s/ high 80s at home on 5 mg of amlodipine   BP Readings from Last 3 Encounters:  06/24/19 (!) 154/80  12/16/18 130/80  07/31/18 136/88     Patient Active Problem List   Diagnosis Date Noted  . Mixed hyperlipidemia 12/05/2014  . Bloating 09/16/2014  . Proteinuria 05/30/2014  . Eosinophilia 02/01/2013  . Elevated PSA 02/01/2013  . HTN (hypertension) 07/27/2012  . Insomnia 07/27/2012  . Hypothyroid 07/27/2012  . ANEMIA, IRON DEFICIENCY 04/18/2008  . CELIAC SPRUE 04/18/2008  . DUODENITIS, WITHOUT HEMORRHAGE 06/02/2007    Past Medical History:  Diagnosis Date  . Allergy   .  Asthma   . Celiac disease   . Chronic kidney disease    nephrotic syndrome  . Hyperlipidemia   . Hypertension   . Insomnia   . PSA elevation   . Thyroid disease     Past Surgical History:  Procedure Laterality Date  . COLONOSCOPY    . EYE SURGERY     cataract- both eyes  . UPPER GASTROINTESTINAL ENDOSCOPY      Social History   Tobacco Use  . Smoking status: Never Smoker  . Smokeless tobacco: Never Used  Substance Use Topics  . Alcohol use: No    Comment: RARELY BEER  . Drug use: No    Family History  Problem Relation Age of Onset  . Alcohol abuse Mother   . COPD Father   . Diabetes Son   . Colon cancer Neg Hx   . Esophageal cancer Neg Hx   . Rectal cancer Neg Hx   . Stomach cancer Neg Hx     No Known Allergies  Medication list has been reviewed and updated.  Current Outpatient Medications on File Prior to Visit  Medication Sig Dispense Refill  . amLODipine (NORVASC) 5 MG tablet Take 1 tablet (5 mg total) by mouth daily. 90 tablet 3  . aspirin 81 MG tablet Take 81 mg by mouth daily. Reported on 06/19/2016    .  atorvastatin (LIPITOR) 20 MG tablet TAKE 1 TABLET BY MOUTH EVERY DAY 90 tablet 1  . budesonide-formoterol (SYMBICORT) 80-4.5 MCG/ACT inhaler Inhale 2 puffs into the lungs 2 (two) times daily.    . colchicine 0.6 MG tablet Take 2 pills for gout attack, then 1 pill an hour later 15 tablet 1  . fluticasone (FLONASE) 50 MCG/ACT nasal spray Place 2 sprays into both nostrils daily. 16 g 6  . levocetirizine (XYZAL) 5 MG tablet Take 1 tablet by mouth daily.  4  . levothyroxine (SYNTHROID) 75 MCG tablet TAKE 1 TABLET BY MOUTH EVERY DAY BEFORE BREAKFAST 90 tablet 1  . traZODone (DESYREL) 50 MG tablet TAKE 1/2-1 TABLET (25-50 MG TOTAL) BY MOUTH AT BEDTIME AS NEEDED FOR SLEEP. 90 tablet 3   No current facility-administered medications on file prior to visit.     Review of Systems:  As per HPI- otherwise negative.   Physical Examination: Vitals:   06/24/19  1435  BP: (!) 154/80  Pulse: 99  Resp: 16  Temp: 98.2 F (36.8 C)  SpO2: 97%   Vitals:   06/24/19 1435  Weight: 162 lb (73.5 kg)  Height: 5' 5.5" (1.664 m)   Body mass index is 26.55 kg/m. Ideal Body Weight: Weight in (lb) to have BMI = 25: 152.2  GEN: WDWN, NAD, Non-toxic, A & O x 3, looks well, mild HOH HEENT: Atraumatic, Normocephalic. Neck supple. No masses, No LAD. Ears and Nose: No external deformity. CV: RRR, No M/G/R. No JVD. No thrill. No extra heart sounds. PULM: CTA B, no wheezes, crackles, rhonchi. No retractions. No resp. distress. No accessory muscle use. ABD: S, NT, ND. No rebound. No HSM. EXTR: No c/c/e NEURO Normal gait.  PSYCH: Normally interactive. Conversant. Not depressed or anxious appearing.  Calm demeanor.   Repeat BP no change  Assessment and Plan:   ICD-10-CM   1. Hypertension, unspecified type  I10 CBC    Comprehensive metabolic panel  2. Hypothyroidism due to acquired atrophy of thyroid  E03.4 TSH  3. Mixed hyperlipidemia  E78.2 Lipid panel  4. CELIAC SPRUE  K90.0   5. Screening for diabetes mellitus  Z13.1 Hemoglobin A1c  6. History of proteinuria syndrome  Z87.448 Microalbumin / creatinine urine ratio   Here today for a follow-up visit Doing overall well BP is a bit high here and at home He will try total of 7.5 mg of amlodipine and will let me know how he responds to this Will plan further follow- up pending labs.   Follow-up: No follow-ups on file.  No orders of the defined types were placed in this encounter.  Orders Placed This Encounter  Procedures  . CBC  . Comprehensive metabolic panel  . Hemoglobin A1c  . Lipid panel  . TSH  . Microalbumin / creatinine urine ratio       Signed Lamar Blinks, MD  6/29- received his labs, message to pt Mild leukocytosis is new Results for orders placed or performed in visit on 06/24/19  CBC  Result Value Ref Range   WBC 12.5 (H) 4.0 - 10.5 K/uL   RBC 4.88 4.22 - 5.81 Mil/uL    Platelets 256.0 150.0 - 400.0 K/uL   Hemoglobin 15.2 13.0 - 17.0 g/dL   HCT 46.0 39.0 - 52.0 %   MCV 94.4 78.0 - 100.0 fl   MCHC 33.0 30.0 - 36.0 g/dL   RDW 13.5 11.5 - 15.5 %  Comprehensive metabolic panel  Result Value Ref Range   Sodium  142 135 - 145 mEq/L   Potassium 4.2 3.5 - 5.1 mEq/L   Chloride 106 96 - 112 mEq/L   CO2 24 19 - 32 mEq/L   Glucose, Bld 88 70 - 99 mg/dL   BUN 13 6 - 23 mg/dL   Creatinine, Ser 1.07 0.40 - 1.50 mg/dL   Total Bilirubin 0.6 0.2 - 1.2 mg/dL   Alkaline Phosphatase 90 39 - 117 U/L   AST 20 0 - 37 U/L   ALT 19 0 - 53 U/L   Total Protein 7.2 6.0 - 8.3 g/dL   Albumin 4.4 3.5 - 5.2 g/dL   Calcium 9.2 8.4 - 10.5 mg/dL   GFR 67.43 >60.00 mL/min  Hemoglobin A1c  Result Value Ref Range   Hgb A1c MFr Bld 5.7 4.6 - 6.5 %  Lipid panel  Result Value Ref Range   Cholesterol 130 0 - 200 mg/dL   Triglycerides 100.0 0.0 - 149.0 mg/dL   HDL 42.90 >39.00 mg/dL   VLDL 20.0 0.0 - 40.0 mg/dL   LDL Cholesterol 67 0 - 99 mg/dL   Total CHOL/HDL Ratio 3    NonHDL 86.70   TSH  Result Value Ref Range   TSH 2.82 0.35 - 4.50 uIU/mL  Microalbumin / creatinine urine ratio  Result Value Ref Range   Microalb, Ur 48.7 (H) 0.0 - 1.9 mg/dL   Creatinine,U 50.8 mg/dL   Microalb Creat Ratio 95.9 (H) 0.0 - 30.0 mg/g

## 2019-06-24 ENCOUNTER — Other Ambulatory Visit: Payer: Self-pay

## 2019-06-24 ENCOUNTER — Ambulatory Visit: Payer: Medicare Other | Admitting: Family Medicine

## 2019-06-24 ENCOUNTER — Encounter: Payer: Self-pay | Admitting: Family Medicine

## 2019-06-24 VITALS — BP 154/80 | HR 99 | Temp 98.2°F | Resp 16 | Ht 65.5 in | Wt 162.0 lb

## 2019-06-24 DIAGNOSIS — E782 Mixed hyperlipidemia: Secondary | ICD-10-CM

## 2019-06-24 DIAGNOSIS — K900 Celiac disease: Secondary | ICD-10-CM | POA: Diagnosis not present

## 2019-06-24 DIAGNOSIS — Z131 Encounter for screening for diabetes mellitus: Secondary | ICD-10-CM

## 2019-06-24 DIAGNOSIS — Z87448 Personal history of other diseases of urinary system: Secondary | ICD-10-CM

## 2019-06-24 DIAGNOSIS — I10 Essential (primary) hypertension: Secondary | ICD-10-CM | POA: Diagnosis not present

## 2019-06-24 DIAGNOSIS — E034 Atrophy of thyroid (acquired): Secondary | ICD-10-CM

## 2019-06-24 NOTE — Patient Instructions (Addendum)
Great to see you today-  I will be in touch with your labs asap  Let's have you try taking a total of 7.5 mg of amlodipine.  Let me know how your BP responds to this dose- I can call in this dose if it seems to be right for you  Take care!    Health Maintenance After Age 75 After age 28, you are at a higher risk for certain long-term diseases and infections as well as injuries from falls. Falls are a major cause of broken bones and head injuries in people who are older than age 20. Getting regular preventive care can help to keep you healthy and well. Preventive care includes getting regular testing and making lifestyle changes as recommended by your health care provider. Talk with your health care provider about:  Which screenings and tests you should have. A screening is a test that checks for a disease when you have no symptoms.  A diet and exercise plan that is right for you. What should I know about screenings and tests to prevent falls? Screening and testing are the best ways to find a health problem early. Early diagnosis and treatment give you the best chance of managing medical conditions that are common after age 31. Certain conditions and lifestyle choices may make you more likely to have a fall. Your health care provider may recommend:  Regular vision checks. Poor vision and conditions such as cataracts can make you more likely to have a fall. If you wear glasses, make sure to get your prescription updated if your vision changes.  Medicine review. Work with your health care provider to regularly review all of the medicines you are taking, including over-the-counter medicines. Ask your health care provider about any side effects that may make you more likely to have a fall. Tell your health care provider if any medicines that you take make you feel dizzy or sleepy.  Osteoporosis screening. Osteoporosis is a condition that causes the bones to get weaker. This can make the bones weak and  cause them to break more easily.  Blood pressure screening. Blood pressure changes and medicines to control blood pressure can make you feel dizzy.  Strength and balance checks. Your health care provider may recommend certain tests to check your strength and balance while standing, walking, or changing positions.  Foot health exam. Foot pain and numbness, as well as not wearing proper footwear, can make you more likely to have a fall.  Depression screening. You may be more likely to have a fall if you have a fear of falling, feel emotionally low, or feel unable to do activities that you used to do.  Alcohol use screening. Using too much alcohol can affect your balance and may make you more likely to have a fall. What actions can I take to lower my risk of falls? General instructions  Talk with your health care provider about your risks for falling. Tell your health care provider if: ? You fall. Be sure to tell your health care provider about all falls, even ones that seem minor. ? You feel dizzy, sleepy, or off-balance.  Take over-the-counter and prescription medicines only as told by your health care provider. These include any supplements.  Eat a healthy diet and maintain a healthy weight. A healthy diet includes low-fat dairy products, low-fat (lean) meats, and fiber from whole grains, beans, and lots of fruits and vegetables. Home safety  Remove any tripping hazards, such as rugs, cords, and clutter.  Install safety equipment such as grab bars in bathrooms and safety rails on stairs.  Keep rooms and walkways well-lit. Activity   Follow a regular exercise program to stay fit. This will help you maintain your balance. Ask your health care provider what types of exercise are appropriate for you.  If you need a cane or walker, use it as recommended by your health care provider.  Wear supportive shoes that have nonskid soles. Lifestyle  Do not drink alcohol if your health care  provider tells you not to drink.  If you drink alcohol, limit how much you have: ? 0-1 drink a day for women. ? 0-2 drinks a day for men.  Be aware of how much alcohol is in your drink. In the [place] one drink equals one typical bottle of beer (12 oz), one-half glass of wine (5 oz), or one shot of hard liquor (1 oz).  Do not use any products that contain nicotine or tobacco, such as cigarettes and e-cigarettes. If you need help quitting, ask your health care provider. Summary  Having a healthy lifestyle and getting preventive care can help to protect your health and wellness after age 4.  Screening and testing are the best way to find a health problem early and help you avoid having a fall. Early diagnosis and treatment give you the best chance for managing medical conditions that are more common for people who are older than age 75.  Falls are a major cause of broken bones and head injuries in people who are older than age 48. Take precautions to prevent a fall at home.  Work with your health care provider to learn what changes you can make to improve your health and wellness and to prevent falls. This information is not intended to replace advice given to you by your health care provider. Make sure you discuss any questions you have with your health care provider. Document Released: 10/29/2017 Document Revised: 10/29/2017 Document Reviewed: 10/29/2017 Elsevier Interactive Patient Education  2019 [employer].

## 2019-06-25 LAB — COMPREHENSIVE METABOLIC PANEL
ALT: 19 U/L (ref 0–53)
AST: 20 U/L (ref 0–37)
Albumin: 4.4 g/dL (ref 3.5–5.2)
Alkaline Phosphatase: 90 U/L (ref 39–117)
BUN: 13 mg/dL (ref 6–23)
CO2: 24 mEq/L (ref 19–32)
Calcium: 9.2 mg/dL (ref 8.4–10.5)
Chloride: 106 mEq/L (ref 96–112)
Creatinine, Ser: 1.07 mg/dL (ref 0.40–1.50)
GFR: 67.43 mL/min (ref >60.00)
Glucose, Bld: 88 mg/dL (ref 70–99)
Potassium: 4.2 mEq/L (ref 3.5–5.1)
Sodium: 142 mEq/L (ref 135–145)
Total Bilirubin: 0.6 mg/dL (ref 0.2–1.2)
Total Protein: 7.2 g/dL (ref 6.0–8.3)

## 2019-06-25 LAB — CBC
HCT: 46 % (ref 39.0–52.0)
Hemoglobin: 15.2 g/dL (ref 13.0–17.0)
MCHC: 33 g/dL (ref 30.0–36.0)
MCV: 94.4 fl (ref 78.0–100.0)
Platelets: 256 10*3/uL (ref 150.0–400.0)
RBC: 4.88 Mil/uL (ref 4.22–5.81)
RDW: 13.5 % (ref 11.5–15.5)
WBC: 12.5 10*3/uL — ABNORMAL HIGH (ref 4.0–10.5)

## 2019-06-25 LAB — LIPID PANEL
Cholesterol: 130 mg/dL (ref 0–200)
HDL: 42.9 mg/dL (ref >39.00)
LDL Cholesterol: 67 mg/dL (ref 0–99)
NonHDL: 86.7
Total CHOL/HDL Ratio: 3
Triglycerides: 100 mg/dL (ref 0.0–149.0)
VLDL: 20 mg/dL (ref 0.0–40.0)

## 2019-06-25 LAB — MICROALBUMIN / CREATININE URINE RATIO
Creatinine,U: 50.8 mg/dL
Microalb Creat Ratio: 95.9 mg/g — ABNORMAL HIGH (ref 0.0–30.0)
Microalb, Ur: 48.7 mg/dL — ABNORMAL HIGH (ref 0.0–1.9)

## 2019-06-25 LAB — TSH: TSH: 2.82 u[IU]/mL (ref 0.35–4.50)

## 2019-06-25 LAB — HEMOGLOBIN A1C: Hgb A1c MFr Bld: 5.7 % (ref 4.6–6.5)

## 2019-06-28 ENCOUNTER — Encounter: Payer: Self-pay | Admitting: Family Medicine

## 2019-06-28 DIAGNOSIS — Z87448 Personal history of other diseases of urinary system: Secondary | ICD-10-CM

## 2019-06-29 ENCOUNTER — Encounter: Payer: Self-pay | Admitting: Family Medicine

## 2019-06-30 ENCOUNTER — Encounter: Payer: Self-pay | Admitting: Family Medicine

## 2019-07-08 ENCOUNTER — Encounter: Payer: Self-pay | Admitting: Family Medicine

## 2019-07-19 ENCOUNTER — Encounter: Payer: Self-pay | Admitting: Family Medicine

## 2019-07-27 ENCOUNTER — Encounter: Payer: Self-pay | Admitting: Family Medicine

## 2019-08-10 ENCOUNTER — Encounter: Payer: Self-pay | Admitting: Family Medicine

## 2019-08-21 ENCOUNTER — Encounter: Payer: Self-pay | Admitting: Family Medicine

## 2019-09-11 ENCOUNTER — Encounter: Payer: Self-pay | Admitting: Family Medicine

## 2019-09-11 DIAGNOSIS — I10 Essential (primary) hypertension: Secondary | ICD-10-CM

## 2019-09-14 ENCOUNTER — Other Ambulatory Visit: Payer: Self-pay | Admitting: Family Medicine

## 2019-09-14 ENCOUNTER — Other Ambulatory Visit: Payer: Self-pay

## 2019-09-14 ENCOUNTER — Encounter: Payer: Self-pay | Admitting: Family Medicine

## 2019-09-14 DIAGNOSIS — I10 Essential (primary) hypertension: Secondary | ICD-10-CM

## 2019-09-14 MED ORDER — AMLODIPINE BESYLATE 2.5 MG PO TABS: 2.5000 mg | ORAL_TABLET | Freq: Every day | ORAL | 3 refills | Status: DC

## 2019-09-14 MED ORDER — AMLODIPINE BESYLATE 5 MG PO TABS: 5.0000 mg | ORAL_TABLET | Freq: Every day | ORAL | 3 refills | Status: DC

## 2019-10-28 LAB — PSA: PSA: 13.4

## 2019-11-17 ENCOUNTER — Other Ambulatory Visit: Payer: Self-pay

## 2019-11-17 NOTE — Patient Instructions (Addendum)
It was great to see you again today, I will be in touch with your labs ASAP Please continue to exercise and keep an eye on your BP- goal is less than 140/90 on average  Please see me in about 6 months I will find out what happened with nephrology and get you set up for an appt Take care!

## 2019-11-17 NOTE — Progress Notes (Addendum)
[place] at [facility] [place], [place], [place] [address] [phone]  Date:  11/18/2019   Name:  Shawn Santos   DOB:  [birth year]   MRN:  [id number]  PCP:  Darreld Mclean, MD    Chief Complaint: Hypertension (6 month follow up)   History of Present Illness:  Shawn Santos is a 73 y.o. very pleasant male patient who presents with the following:  Here today for routine follow-up visit History of hyperlipidemia, intermittent proteinuria and lower extremity swelling, hypertension, hypothyroidism, iron deficiency anemia, celiac sprue His PSA is followed by Dr Zannie Cove  Seen by myself most recently in [place] that time his blood pressure was a little bit high, we had him increase amlodipine to 7.5 mg He was noted to have mild leukocytosis-would like to recheck CBC today A1c at prediabetes range Noted worsening of urine proteinuria in June-referred back to nephrology but I am not sure if he saw them as of yet.  He did not hear from them so far - referral placed end of June/ early July  He chose not to go back to work at [employer] at [employer]- he has retired He misses his job, but feels safer being at home during the pandemic  His son lives nearby He is walking between 2-1/2 and 4 miles a day for exercise  Immunizations up-to-date Shingrix complete  Patient Active Problem List   Diagnosis Date Noted  . Mixed hyperlipidemia 12/05/2014  . Bloating 09/16/2014  . Proteinuria 05/30/2014  . Eosinophilia 02/01/2013  . Elevated PSA 02/01/2013  . HTN (hypertension) 07/27/2012  . Insomnia 07/27/2012  . Hypothyroid 07/27/2012  . ANEMIA, IRON DEFICIENCY 04/18/2008  . CELIAC SPRUE 04/18/2008  . DUODENITIS, WITHOUT HEMORRHAGE 06/02/2007    Past Medical History:  Diagnosis Date  . Allergy   . Asthma   . Celiac disease   . Chronic kidney disease    nephrotic syndrome  . Hyperlipidemia   . Hypertension   . Insomnia    . PSA elevation   . Thyroid disease     Past Surgical History:  Procedure Laterality Date  . COLONOSCOPY    . EYE SURGERY     cataract- both eyes  . UPPER GASTROINTESTINAL ENDOSCOPY      Social History   Tobacco Use  . Smoking status: Never Smoker  . Smokeless tobacco: Never Used  Substance Use Topics  . Alcohol use: No    Comment: RARELY BEER  . Drug use: No    Family History  Problem Relation Age of Onset  . Alcohol abuse Mother   . COPD Father   . Diabetes Son   . Colon cancer Neg Hx   . Esophageal cancer Neg Hx   . Rectal cancer Neg Hx   . Stomach cancer Neg Hx     No Known Allergies  Medication list has been reviewed and updated.  Current Outpatient Medications on File Prior to Visit  Medication Sig Dispense Refill  . amLODipine (NORVASC) 2.5 MG tablet Take 1 tablet (2.5 mg total) by mouth daily. 90 tablet 3  . amLODipine (NORVASC) 5 MG tablet Take 1 tablet (5 mg total) by mouth daily. 90 tablet 3  . aspirin 81 MG tablet Take 81 mg by mouth daily. Reported on 06/19/2016    . atorvastatin (LIPITOR) 20 MG tablet TAKE 1 TABLET BY MOUTH EVERY DAY 90 tablet 1  . budesonide-formoterol (SYMBICORT) 80-4.5 MCG/ACT inhaler Inhale  2 puffs into the lungs 2 (two) times daily.    . colchicine 0.6 MG tablet Take 2 pills for gout attack, then 1 pill an hour later 15 tablet 1  . fluticasone (FLONASE) 50 MCG/ACT nasal spray Place 2 sprays into both nostrils daily. 16 g 6  . levocetirizine (XYZAL) 5 MG tablet Take 1 tablet by mouth daily.  4  . levothyroxine (SYNTHROID) 75 MCG tablet TAKE 1 TABLET BY MOUTH EVERY DAY BEFORE BREAKFAST 90 tablet 1  . traZODone (DESYREL) 50 MG tablet TAKE 1/2-1 TABLET (25-50 MG TOTAL) BY MOUTH AT BEDTIME AS NEEDED FOR SLEEP. 90 tablet 3   No current facility-administered medications on file prior to visit.     Review of Systems:  As per HPI- otherwise negative. His BP at home is generally 130-140/70-80 Today was a little higher than normal   No chest pain or shortness of breath with activity Physical Examination: Vitals:   11/18/19 1104  BP: (!) 142/90  Pulse: 91  Resp: 17  Temp: (!) 97.2 F (36.2 C)  SpO2: 95%   Vitals:   11/18/19 1104  Weight: 161 lb (73 kg)  Height: 5\' 5"  (1.651 m)   Body mass index is 26.79 kg/m. Ideal Body Weight: Weight in (lb) to have BMI = 25: 149.9  GEN: WDWN, NAD, Non-toxic, A & O x 3, well-appearing older gentleman HEENT: Atraumatic, Normocephalic. Neck supple. No masses, No LAD. Ears and Nose: No external deformity. CV: RRR, No M/G/R. No JVD. No thrill. No extra heart sounds. PULM: CTA B, no wheezes, crackles, rhonchi. No retractions. No resp. distress. No accessory muscle use. ABD: S, NT, ND, +BS. No rebound. No HSM. EXTR: No c/c/e NEURO Normal gait.  PSYCH: Normally interactive. Conversant. Not depressed or anxious appearing.  Calm demeanor.    Assessment and Plan: Hypertension, unspecified type - Plan: CBC  Hypothyroidism due to acquired atrophy of thyroid - Plan: TSH  Mixed hyperlipidemia - Plan: Lipid panel  Elevated glucose - Plan: Hemoglobin A1c  Proteinuria, unspecified type  Here today for a follow-up visit Blood pressure is under okay control, continue current medication Thyroid, lipid, A1c pending He has history of proteinuria.  I called [facility] today to inquire about his referral, they plan to give patient a call to update him  Will plan further follow- up pending labs. Asked him to see me in 6 months for follow-up visit  Signed Lamar Blinks, MD   Received his labs, message to patient Results for orders placed or performed in visit on 11/18/19  CBC  Result Value Ref Range   WBC 9.2 4.0 - 10.5 K/uL   RBC 5.07 4.22 - 5.81 Mil/uL   Platelets 246.0 150.0 - 400.0 K/uL   Hemoglobin 15.7 13.0 - 17.0 g/dL   HCT 47.7 39.0 - 52.0 %   MCV 94.1 78.0 - 100.0 fl   MCHC 33.0 30.0 - 36.0 g/dL   RDW 14.1 11.5 - 15.5 %  Hemoglobin A1c  Result Value Ref  Range   Hgb A1c MFr Bld 5.6 4.6 - 6.5 %  Lipid panel  Result Value Ref Range   Cholesterol 170 0 - 200 mg/dL   Triglycerides 101.0 0.0 - 149.0 mg/dL   HDL 47.00 >39.00 mg/dL   VLDL 20.2 0.0 - 40.0 mg/dL   LDL Cholesterol 103 (H) 0 - 99 mg/dL   Total CHOL/HDL Ratio 4    NonHDL 122.76   TSH  Result Value Ref Range   TSH 3.80 0.35 -  4.50 uIU/mL

## 2019-11-18 ENCOUNTER — Encounter: Payer: Self-pay | Admitting: Family Medicine

## 2019-11-18 ENCOUNTER — Ambulatory Visit (INDEPENDENT_AMBULATORY_CARE_PROVIDER_SITE_OTHER): Payer: Medicare Other | Admitting: Family Medicine

## 2019-11-18 VITALS — BP 142/90 | HR 91 | Temp 97.2°F | Resp 17 | Ht 65.0 in | Wt 161.0 lb

## 2019-11-18 DIAGNOSIS — E782 Mixed hyperlipidemia: Secondary | ICD-10-CM

## 2019-11-18 DIAGNOSIS — I10 Essential (primary) hypertension: Secondary | ICD-10-CM

## 2019-11-18 DIAGNOSIS — R7309 Other abnormal glucose: Secondary | ICD-10-CM | POA: Diagnosis not present

## 2019-11-18 DIAGNOSIS — R809 Proteinuria, unspecified: Secondary | ICD-10-CM

## 2019-11-18 DIAGNOSIS — E034 Atrophy of thyroid (acquired): Secondary | ICD-10-CM

## 2019-11-18 LAB — LIPID PANEL
Cholesterol: 170 mg/dL (ref 0–200)
HDL: 47 mg/dL (ref >39.00)
LDL Cholesterol: 103 mg/dL — ABNORMAL HIGH (ref 0–99)
NonHDL: 122.76
Total CHOL/HDL Ratio: 4
Triglycerides: 101 mg/dL (ref 0.0–149.0)
VLDL: 20.2 mg/dL (ref 0.0–40.0)

## 2019-11-18 LAB — CBC
HCT: 47.7 % (ref 39.0–52.0)
Hemoglobin: 15.7 g/dL (ref 13.0–17.0)
MCHC: 33 g/dL (ref 30.0–36.0)
MCV: 94.1 fl (ref 78.0–100.0)
Platelets: 246 10*3/uL (ref 150.0–400.0)
RBC: 5.07 Mil/uL (ref 4.22–5.81)
RDW: 14.1 % (ref 11.5–15.5)
WBC: 9.2 10*3/uL (ref 4.0–10.5)

## 2019-11-18 LAB — TSH: TSH: 3.8 u[IU]/mL (ref 0.35–4.50)

## 2019-11-18 LAB — HEMOGLOBIN A1C: Hgb A1c MFr Bld: 5.6 % (ref 4.6–6.5)

## 2019-11-19 MED ORDER — ROSUVASTATIN CALCIUM 20 MG PO TABS: 20.0000 mg | ORAL_TABLET | Freq: Every day | ORAL | 3 refills | Status: DC

## 2019-12-07 ENCOUNTER — Encounter: Payer: Self-pay | Admitting: Family Medicine

## 2019-12-07 DIAGNOSIS — I10 Essential (primary) hypertension: Secondary | ICD-10-CM

## 2019-12-07 MED ORDER — AMLODIPINE BESYLATE 2.5 MG PO TABS: 2.5000 mg | ORAL_TABLET | Freq: Every day | ORAL | 3 refills | Status: DC

## 2019-12-07 MED ORDER — AMLODIPINE BESYLATE 5 MG PO TABS: 5.0000 mg | ORAL_TABLET | Freq: Every day | ORAL | 3 refills | Status: DC

## 2019-12-17 ENCOUNTER — Other Ambulatory Visit: Payer: Self-pay | Admitting: Family Medicine

## 2019-12-17 ENCOUNTER — Encounter: Payer: Self-pay | Admitting: Family Medicine

## 2019-12-17 DIAGNOSIS — E038 Other specified hypothyroidism: Secondary | ICD-10-CM

## 2019-12-17 DIAGNOSIS — E785 Hyperlipidemia, unspecified: Secondary | ICD-10-CM

## 2019-12-17 DIAGNOSIS — E782 Mixed hyperlipidemia: Secondary | ICD-10-CM

## 2020-01-03 ENCOUNTER — Encounter: Payer: Self-pay | Admitting: Family Medicine

## 2020-01-28 DIAGNOSIS — N049 Nephrotic syndrome with unspecified morphologic changes: Secondary | ICD-10-CM | POA: Diagnosis not present

## 2020-01-28 DIAGNOSIS — I129 Hypertensive chronic kidney disease with stage 1 through stage 4 chronic kidney disease, or unspecified chronic kidney disease: Secondary | ICD-10-CM | POA: Diagnosis not present

## 2020-02-04 ENCOUNTER — Encounter: Payer: Self-pay | Admitting: Family Medicine

## 2020-02-09 DIAGNOSIS — I129 Hypertensive chronic kidney disease with stage 1 through stage 4 chronic kidney disease, or unspecified chronic kidney disease: Secondary | ICD-10-CM | POA: Diagnosis not present

## 2020-02-11 ENCOUNTER — Encounter: Payer: Self-pay | Admitting: Family Medicine

## 2020-02-14 ENCOUNTER — Other Ambulatory Visit: Payer: Self-pay

## 2020-02-14 ENCOUNTER — Encounter: Payer: Self-pay | Admitting: Family Medicine

## 2020-02-14 DIAGNOSIS — E782 Mixed hyperlipidemia: Secondary | ICD-10-CM

## 2020-02-14 DIAGNOSIS — E785 Hyperlipidemia, unspecified: Secondary | ICD-10-CM

## 2020-02-14 MED ORDER — ATORVASTATIN CALCIUM 20 MG PO TABS: 20.0000 mg | ORAL_TABLET | Freq: Every day | ORAL | 3 refills | Status: DC

## 2020-02-17 ENCOUNTER — Other Ambulatory Visit: Payer: Self-pay | Admitting: Family Medicine

## 2020-02-17 DIAGNOSIS — F5101 Primary insomnia: Secondary | ICD-10-CM

## 2020-02-27 ENCOUNTER — Encounter: Payer: Self-pay | Admitting: Family Medicine

## 2020-02-28 ENCOUNTER — Encounter: Payer: Self-pay | Admitting: Family Medicine

## 2020-03-02 ENCOUNTER — Ambulatory Visit: Payer: Medicare PPO | Attending: Internal Medicine

## 2020-03-02 ENCOUNTER — Encounter: Payer: Self-pay | Admitting: Family Medicine

## 2020-03-02 DIAGNOSIS — Z23 Encounter for immunization: Secondary | ICD-10-CM

## 2020-03-02 NOTE — Progress Notes (Signed)
   Covid-19 Vaccination Clinic  Name:  Shawn Santos    MRN: [id number] DOB: 1946/03/13  03/02/2020  [name] was observed post Covid-19 immunization for 15 minutes without incident. He was provided with Vaccine Information Sheet and instruction to access the V-Safe system.   [name] was instructed to call 911 with any severe reactions post vaccine: Marland Kitchen Difficulty breathing  . Swelling of face and throat  . A fast heartbeat  . A bad rash all over body  . Dizziness and weakness

## 2020-03-15 ENCOUNTER — Encounter: Payer: Self-pay | Admitting: Family Medicine

## 2020-03-15 DIAGNOSIS — I10 Essential (primary) hypertension: Secondary | ICD-10-CM

## 2020-03-16 MED ORDER — AMLODIPINE BESYLATE 2.5 MG PO TABS: 2.5000 mg | ORAL_TABLET | Freq: Every day | ORAL | 3 refills | Status: DC

## 2020-03-28 ENCOUNTER — Encounter: Payer: Self-pay | Admitting: Family Medicine

## 2020-03-28 ENCOUNTER — Ambulatory Visit: Payer: Medicare PPO | Attending: Internal Medicine

## 2020-03-28 DIAGNOSIS — Z23 Encounter for immunization: Secondary | ICD-10-CM

## 2020-03-28 NOTE — Progress Notes (Signed)
   Covid-19 Vaccination Clinic  Name:  Shawn Santos    MRN: [id number] DOB: 1946-05-16  03/28/2020  Mr. [name] was observed post Covid-19 immunization for 15 minutes without incident. He was provided with Vaccine Information Sheet and instruction to access the V-Safe system.   Mr. [name] was instructed to call 911 with any severe reactions post vaccine: Marland Kitchen Difficulty breathing  . Swelling of face and throat  . A fast heartbeat  . A bad rash all over body  . Dizziness and weakness   Immunizations Administered    Name Date Dose VIS Date Route   Pfizer COVID-19 Vaccine 03/28/2020  1:29 PM 0.3 mL 12/10/2019 Intramuscular   Manufacturer: [employer], [employer]   Lot: [id number]   [place]: [id number]

## 2020-05-11 ENCOUNTER — Encounter: Payer: Self-pay | Admitting: Family Medicine

## 2020-05-11 DIAGNOSIS — I129 Hypertensive chronic kidney disease with stage 1 through stage 4 chronic kidney disease, or unspecified chronic kidney disease: Secondary | ICD-10-CM | POA: Diagnosis not present

## 2020-05-15 LAB — COMPREHENSIVE METABOLIC PANEL
Calcium: 9.3 (ref 8.7–10.7)
GFR calc non Af Amer: 58

## 2020-05-15 LAB — BASIC METABOLIC PANEL
BUN: 20 (ref 4–21)
CO2: 25 — AB (ref 13–22)
Chloride: 104 (ref 99–108)
Creatinine: 1.2 (ref 0.6–1.3)
Glucose: 150
Potassium: 4.8 (ref 3.4–5.3)
Sodium: 139 (ref 137–147)

## 2020-05-16 NOTE — Progress Notes (Addendum)
[place] at [facility] [place], [place], [place] [address] [phone]  Date:  05/17/2020   Name:  Shawn Santos   DOB:  Dec 18, 1946   MRN:  [id number]  PCP:  Darreld Mclean, MD    Chief Complaint: Annual Exam   History of Present Illness:  Shawn Santos is a 74 y.o. 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  No chest pain or shortness of breath  COVID-19 vaccine is complete Colon cancer screen up-to-date Shingrix complete Can do labs today, lipid, CBC, A1c done in November  Amlodipine Aspirin Lipitor-?  Crestor-he is not quite sure which statin he is taking, will check when he gets home Symbicort Synthroid Patient Active Problem List   Diagnosis Date Noted  . Mixed hyperlipidemia 12/05/2014  . Bloating 09/16/2014  . Proteinuria 05/30/2014  . Eosinophilia 02/01/2013  . Elevated PSA 02/01/2013  . HTN (hypertension) 07/27/2012  . Insomnia 07/27/2012  . Hypothyroid 07/27/2012  . ANEMIA, IRON DEFICIENCY 04/18/2008  . CELIAC SPRUE 04/18/2008  . DUODENITIS, WITHOUT HEMORRHAGE 06/02/2007    Past Medical History:  Diagnosis Date  . Allergy   . Asthma   . Celiac disease   . Chronic kidney disease    nephrotic syndrome  . Hyperlipidemia   . Hypertension   . Insomnia   . PSA  elevation   . Thyroid disease     Past Surgical History:  Procedure Laterality Date  . COLONOSCOPY    . EYE SURGERY     cataract- both eyes  . UPPER GASTROINTESTINAL ENDOSCOPY      Social History   Tobacco Use  . Smoking status: Never Smoker  . Smokeless tobacco: Never Used  Substance Use Topics  . Alcohol use: No    Comment: RARELY BEER  . Drug use: No    Family History  Problem Relation Age of Onset  . Alcohol abuse Mother   . COPD Father   . Diabetes Son   . Colon cancer Neg Hx   . Esophageal cancer Neg Hx   . Rectal cancer Neg Hx   . Stomach cancer Neg Hx     No Known Allergies  Medication list has been reviewed and updated.  Current Outpatient Medications on File Prior to Visit  Medication Sig Dispense Refill  . amLODipine (NORVASC) 2.5 MG tablet Take 1 tablet (2.5 mg total) by mouth daily. 90 tablet 3  . amLODipine (NORVASC) 5 MG tablet Take 1 tablet (5 mg total) by mouth daily. 90 tablet 3  . aspirin 81 MG tablet Take 81 mg by mouth daily. Reported on 06/19/2016    . atorvastatin (LIPITOR) 20 MG tablet Take 1 tablet (20 mg total) by mouth daily. 90 tablet 3  . budesonide-formoterol (SYMBICORT) 80-4.5 MCG/ACT inhaler Inhale 2 puffs into  the lungs 2 (two) times daily.    . colchicine 0.6 MG tablet Take 2 pills for gout attack, then 1 pill an hour later 15 tablet 1  . fluticasone (FLONASE) 50 MCG/ACT nasal spray Place 2 sprays into both nostrils daily. 16 g 6  . levocetirizine (XYZAL) 5 MG tablet Take 1 tablet by mouth daily.  4  . levothyroxine (SYNTHROID) 75 MCG tablet TAKE 1 TABLET BY MOUTH EVERY DAY BEFORE BREAKFAST 90 tablet 1  . rosuvastatin (CRESTOR) 20 MG tablet Take 1 tablet (20 mg total) by mouth daily. 90 tablet 3  . traZODone (DESYREL) 50 MG tablet TAKE 1/2-1 TABLET (25-50 MG TOTAL) BY MOUTH AT BEDTIME AS NEEDED FOR SLEEP. 90 tablet 3  . losartan (COZAAR) 50 MG tablet      No current facility-administered medications on file prior to visit.     Review of Systems:  As per HPI- otherwise negative.   Physical Examination: Vitals:   05/17/20 1100 05/17/20 1117  BP: (!) 150/83 126/82  Pulse: 87   Resp: 16   Temp: 98.2 F (36.8 C)   SpO2: 97%    Vitals:   05/17/20 1100  Weight: 160 lb (72.6 kg)  Height: 5\' 5"  (1.651 m)   Body mass index is 26.63 kg/m. Ideal Body Weight: Weight in (lb) to have BMI = 25: 149.9  GEN: no acute distress.  Normal weight for age, looks well HEENT: Atraumatic, Normocephalic.   Bilateral TM wnl, oropharynx normal.  PEERL,EOMI.   Ears and Nose: No external deformity. CV: RRR, No M/G/R. No JVD. No thrill. No extra heart sounds. PULM: CTA B, no wheezes, crackles, rhonchi. No retractions. No resp. distress. No accessory muscle use. ABD: S, NT, ND, +BS. No rebound. No HSM. EXTR: No c/c/e PSYCH: Normally interactive. Conversant.    Assessment and Plan: Hypertension, unspecified type - Plan: CBC  Hypothyroidism due to acquired atrophy of thyroid - Plan: TSH  Elevated glucose  Dyslipidemia  Shawn Santos is here today for routine follow-up visit.  His blood pressures under good control We will check CBC and TSH today A1c and cholesterol checked in November  We discussed health maintenance, up-to-date Will plan further follow- up pending labs.  Plan for visit in 6 months Moderate medical decision making today  This visit occurred during the SARS-CoV-2 public health emergency.  Safety protocols were in place, including screening questions prior to the visit, additional usage of staff PPE, and extensive cleaning of exam room while observing appropriate contact time as indicated for disinfecting solutions.    Signed Lamar Blinks, MD  Received labs as follows, message to pt  Results for orders placed or performed in visit on 05/17/20  CBC  Result Value Ref Range   WBC 7.9 4.0 - 10.5 K/uL   RBC 4.73 4.22 - 5.81 Mil/uL   Platelets 242.0 150.0 - 400.0 K/uL   Hemoglobin 15.0 13.0 - 17.0  g/dL   HCT 44.7 39.0 - 52.0 %   MCV 94.6 78.0 - 100.0 fl   MCHC 33.6 30.0 - 36.0 g/dL   RDW 13.4 11.5 - 15.5 %  TSH  Result Value Ref Range   TSH 2.29 0.35 - 4.50 uIU/mL

## 2020-05-17 ENCOUNTER — Encounter: Payer: Self-pay | Admitting: Family Medicine

## 2020-05-17 ENCOUNTER — Other Ambulatory Visit: Payer: Self-pay

## 2020-05-17 ENCOUNTER — Ambulatory Visit (INDEPENDENT_AMBULATORY_CARE_PROVIDER_SITE_OTHER): Payer: Medicare PPO | Admitting: Family Medicine

## 2020-05-17 VITALS — BP 126/82 | HR 87 | Temp 98.2°F | Resp 16 | Ht 65.0 in | Wt 160.0 lb

## 2020-05-17 DIAGNOSIS — E034 Atrophy of thyroid (acquired): Secondary | ICD-10-CM

## 2020-05-17 DIAGNOSIS — I10 Essential (primary) hypertension: Secondary | ICD-10-CM

## 2020-05-17 DIAGNOSIS — E785 Hyperlipidemia, unspecified: Secondary | ICD-10-CM

## 2020-05-17 DIAGNOSIS — R7309 Other abnormal glucose: Secondary | ICD-10-CM | POA: Diagnosis not present

## 2020-05-17 LAB — CBC
HCT: 44.7 % (ref 39.0–52.0)
Hemoglobin: 15 g/dL (ref 13.0–17.0)
MCHC: 33.6 g/dL (ref 30.0–36.0)
MCV: 94.6 fl (ref 78.0–100.0)
Platelets: 242 10*3/uL (ref 150.0–400.0)
RBC: 4.73 Mil/uL (ref 4.22–5.81)
RDW: 13.4 % (ref 11.5–15.5)
WBC: 7.9 10*3/uL (ref 4.0–10.5)

## 2020-05-17 LAB — TSH: TSH: 2.29 u[IU]/mL (ref 0.35–4.50)

## 2020-05-17 NOTE — Patient Instructions (Signed)
Great to see you again today as always.  Please see me in 6 months I will be in touch with your labs asap

## 2020-05-23 ENCOUNTER — Encounter: Payer: Self-pay | Admitting: Family Medicine

## 2020-06-09 ENCOUNTER — Other Ambulatory Visit: Payer: Self-pay | Admitting: Family Medicine

## 2020-06-09 DIAGNOSIS — E038 Other specified hypothyroidism: Secondary | ICD-10-CM

## 2020-06-11 ENCOUNTER — Encounter: Payer: Self-pay | Admitting: Family Medicine

## 2020-07-18 DIAGNOSIS — J4530 Mild persistent asthma, uncomplicated: Secondary | ICD-10-CM | POA: Diagnosis not present

## 2020-07-18 DIAGNOSIS — J301 Allergic rhinitis due to pollen: Secondary | ICD-10-CM | POA: Diagnosis not present

## 2020-07-18 DIAGNOSIS — J3089 Other allergic rhinitis: Secondary | ICD-10-CM | POA: Diagnosis not present

## 2020-07-18 DIAGNOSIS — H1045 Other chronic allergic conjunctivitis: Secondary | ICD-10-CM | POA: Diagnosis not present

## 2020-08-07 DIAGNOSIS — H903 Sensorineural hearing loss, bilateral: Secondary | ICD-10-CM | POA: Diagnosis not present

## 2020-09-01 ENCOUNTER — Encounter: Payer: Self-pay | Admitting: Family Medicine

## 2020-09-09 ENCOUNTER — Encounter: Payer: Self-pay | Admitting: Family Medicine

## 2020-10-13 DIAGNOSIS — L570 Actinic keratosis: Secondary | ICD-10-CM | POA: Diagnosis not present

## 2020-10-13 DIAGNOSIS — L814 Other melanin hyperpigmentation: Secondary | ICD-10-CM | POA: Diagnosis not present

## 2020-10-13 DIAGNOSIS — L738 Other specified follicular disorders: Secondary | ICD-10-CM | POA: Diagnosis not present

## 2020-10-13 DIAGNOSIS — L821 Other seborrheic keratosis: Secondary | ICD-10-CM | POA: Diagnosis not present

## 2020-10-13 DIAGNOSIS — D692 Other nonthrombocytopenic purpura: Secondary | ICD-10-CM | POA: Diagnosis not present

## 2020-10-20 ENCOUNTER — Encounter: Payer: Self-pay | Admitting: Family Medicine

## 2020-10-30 DIAGNOSIS — R972 Elevated prostate specific antigen [PSA]: Secondary | ICD-10-CM | POA: Diagnosis not present

## 2020-11-02 ENCOUNTER — Other Ambulatory Visit: Payer: Self-pay | Admitting: Family Medicine

## 2020-11-02 DIAGNOSIS — E782 Mixed hyperlipidemia: Secondary | ICD-10-CM

## 2020-11-13 DIAGNOSIS — R972 Elevated prostate specific antigen [PSA]: Secondary | ICD-10-CM | POA: Diagnosis not present

## 2020-11-13 DIAGNOSIS — N400 Enlarged prostate without lower urinary tract symptoms: Secondary | ICD-10-CM | POA: Diagnosis not present

## 2020-11-13 NOTE — Patient Instructions (Addendum)
It was great to see you again today! I will be in touch with your blood work  Please let me know what you find out regarding your prostate at your next visit and have a wonderful holiday season!

## 2020-11-13 NOTE — Progress Notes (Addendum)
[place] at [facility] [address] [place], [place] [address] [phone]  Date:  11/20/2020   Name:  Shawn Santos   DOB:  01/07/1946   MRN:  [id number]  PCP:  Darreld Mclean, MD    Chief Complaint: Hypertension (follow up) and Lab Work (psa level elevated checked at urologist)   History of Present Illness:  Shawn Santos is a 74 y.o. very pleasant male patient who presents with the following:  Shawn Santos is here today for periodic follow-up Last seen by myself in May- history of hypertension, celiac sprue, hypothyroidism, hyperlipidemia, anemia, proteinuria He sees urology annually- most recent visit with DR Dahlstedt 11/13/20.  His PSA went up quite a bit; they plan for short term recheck and may be an MRI if elevation continues  Admits to feeling a bit anxious about this, we discussed possible outcomes together. He continues to do well otherwise, he is feeling fine and staying active He is gluten free due to celiac -he is up-to-date on his colonoscopy  Retired from his work as a Licensed conveyancer at [employer] He has a son who lives nearby, enjoys walking for exercise No chest pain or shortness of breath with exercise He plans to have Thanksgiving with her son and son's girlfriend  COVID-19 series is complete including booster Flu up-to-date Shingrix complete Can do lab work and cholesterol today- he is not fasting today, he had some oatmeal   Amlodipine Aspirin 81 Synthroid Losartan Crestor Trazodone for sleep Colchicine as needed Symbicort  He does check his home blood pressure periodically.  Home BP 135-140/75- 80 on a consistent basis   Pulse Readings from Last 3 Encounters:  11/20/20 (!) 112  05/17/20 87  11/18/19 91  Noticing tachycardia today.  This did come back to normal with recheck.  Patient admits to feeling somewhat anxious about his prostate concerns  Patient Active Problem List   Diagnosis Date Noted  .  Mixed hyperlipidemia 12/05/2014  . Bloating 09/16/2014  . Proteinuria 05/30/2014  . Eosinophilia 02/01/2013  . Elevated PSA 02/01/2013  . HTN (hypertension) 07/27/2012  . Insomnia 07/27/2012  . Hypothyroid 07/27/2012  . ANEMIA, IRON DEFICIENCY 04/18/2008  . CELIAC SPRUE 04/18/2008  . DUODENITIS, WITHOUT HEMORRHAGE 06/02/2007    Past Medical History:  Diagnosis Date  . Allergy   . Asthma   . Celiac disease   . Chronic kidney disease    nephrotic syndrome  . Hyperlipidemia   . Hypertension   . Insomnia   . PSA elevation   . Thyroid disease     Past Surgical History:  Procedure Laterality Date  . COLONOSCOPY    . EYE SURGERY     cataract- both eyes  . UPPER GASTROINTESTINAL ENDOSCOPY      Social History   Tobacco Use  . Smoking status: Never Smoker  . Smokeless tobacco: Never Used  Substance Use Topics  . Alcohol use: No    Comment: RARELY BEER  . Drug use: No    Family History  Problem Relation Age of Onset  . Alcohol abuse Mother   . COPD Father   . Diabetes Son   . Colon cancer Neg Hx   . Esophageal cancer Neg Hx   . Rectal cancer Neg Hx   . Stomach cancer Neg Hx     No Known Allergies  Medication list has been reviewed and updated.  Current Outpatient Medications on File Prior to Visit  Medication  Sig Dispense Refill  . amLODipine (NORVASC) 2.5 MG tablet Take 1 tablet (2.5 mg total) by mouth daily. 90 tablet 3  . amLODipine (NORVASC) 5 MG tablet Take 1 tablet (5 mg total) by mouth daily. 90 tablet 3  . aspirin 81 MG tablet Take 81 mg by mouth daily. Reported on 06/19/2016    . budesonide-formoterol (SYMBICORT) 80-4.5 MCG/ACT inhaler Inhale 2 puffs into the lungs 2 (two) times daily.    . colchicine 0.6 MG tablet Take 2 pills for gout attack, then 1 pill an hour later 15 tablet 1  . fluticasone (FLONASE) 50 MCG/ACT nasal spray Place 2 sprays into both nostrils daily. 16 g 6  . levocetirizine (XYZAL) 5 MG tablet Take 1 tablet by mouth daily.  4  .  levothyroxine (SYNTHROID) 75 MCG tablet TAKE 1 TABLET BY MOUTH EVERY DAY BEFORE BREAKFAST 90 tablet 1  . losartan (COZAAR) 50 MG tablet     . rosuvastatin (CRESTOR) 20 MG tablet TAKE 1 TABLET BY MOUTH EVERY DAY 90 tablet 3  . traZODone (DESYREL) 50 MG tablet TAKE 1/2-1 TABLET (25-50 MG TOTAL) BY MOUTH AT BEDTIME AS NEEDED FOR SLEEP. 90 tablet 3   No current facility-administered medications on file prior to visit.    Review of Systems:  As per HPI- otherwise negative.   Physical Examination: Vitals:   11/20/20 1045  BP: (!) 160/110  Pulse: (!) 112  Resp: 17  SpO2: 98%   Vitals:   11/20/20 1045  Weight: 169 lb (76.7 kg)  Height: 5\' 5"  (1.651 m)   Body mass index is 28.12 kg/m. Ideal Body Weight: Weight in (lb) to have BMI = 25: 149.9  GEN: no acute distress. HEENT: Atraumatic, Normocephalic.  Ears and Nose: No external deformity. CV: RRR, No M/G/R. No JVD. No thrill. No extra heart sounds. PULM: CTA B, no wheezes, crackles, rhonchi. No retractions. No resp. distress. No accessory muscle use. ABD: S, NT, ND, +BS. No rebound. No HSM. EXTR: No c/c/e PSYCH: Normally interactive. Conversant.    Assessment and Plan: Hypertension, unspecified type - Plan: CBC, Comprehensive metabolic panel  Hypothyroidism due to acquired atrophy of thyroid - Plan: TSH  Elevated glucose - Plan: Hemoglobin A1c  Dyslipidemia - Plan: Lipid panel  CELIAC SPRUE  Elevated PSA    Shawn Santos is here today for follow-up visit Blood pressure is borderline high, however we have had problems with him getting too low on higher levels of medication.  For the time being we will continue current regimen.  He will continue to monitor at home  Check thyroid today and adjust levothyroxine if needed Follow-up A1c today for elevated blood sugars Check lipids today, he is taking Crestor 20 His colonoscopy is up-to-date, reminded him to continue gluten-free diet due to celiac sprue  Discussed his PSA, he has  plans to follow-up with his urologist in February and will let me know the result  Will plan further follow- up pending labs. Assuming all is well, plan to visit in 6 months This visit occurred during the SARS-CoV-2 public health emergency.  Safety protocols were in place, including screening questions prior to the visit, additional usage of staff PPE, and extensive cleaning of exam room while observing appropriate contact time as indicated for disinfecting solutions.     Signed Lamar Blinks, MD  Received labs as below, message to patient  Results for orders placed or performed in visit on 11/20/20  CBC  Result Value Ref Range   WBC 7.7 4.0 -  10.5 K/uL   RBC 4.73 4.22 - 5.81 Mil/uL   Platelets 205.0 150 - 400 K/uL   Hemoglobin 14.9 13.0 - 17.0 g/dL   HCT 44.4 39 - 52 %   MCV 93.9 78.0 - 100.0 fl   MCHC 33.6 30.0 - 36.0 g/dL   RDW 13.5 11.5 - 15.5 %  Comprehensive metabolic panel  Result Value Ref Range   Sodium 144 135 - 145 mEq/L   Potassium 4.1 3.5 - 5.1 mEq/L   Chloride 110 96 - 112 mEq/L   CO2 24 19 - 32 mEq/L   Glucose, Bld 90 70 - 99 mg/dL   BUN 16 6 - 23 mg/dL   Creatinine, Ser 1.05 0.40 - 1.50 mg/dL   Total Bilirubin 0.5 0.2 - 1.2 mg/dL   Alkaline Phosphatase 67 39 - 117 U/L   AST 17 0 - 37 U/L   ALT 18 0 - 53 U/L   Total Protein 7.0 6.0 - 8.3 g/dL   Albumin 4.6 3.5 - 5.2 g/dL   GFR 69.15 >60.00 mL/min   Calcium 9.3 8.4 - 10.5 mg/dL  Lipid panel  Result Value Ref Range   Cholesterol 125 0 - 200 mg/dL   Triglycerides 95.0 0 - 149 mg/dL   HDL 49.50 >39.00 mg/dL   VLDL 19.0 0.0 - 40.0 mg/dL   LDL Cholesterol 56 0 - 99 mg/dL   Total CHOL/HDL Ratio 3    NonHDL 75.37   TSH  Result Value Ref Range   TSH 1.93 0.35 - 4.50 uIU/mL  Hemoglobin A1c  Result Value Ref Range   Hgb A1c MFr Bld 5.7 4.6 - 6.5 %

## 2020-11-20 ENCOUNTER — Encounter: Payer: Self-pay | Admitting: Family Medicine

## 2020-11-20 ENCOUNTER — Ambulatory Visit: Payer: Medicare PPO | Admitting: Family Medicine

## 2020-11-20 ENCOUNTER — Other Ambulatory Visit: Payer: Self-pay

## 2020-11-20 VITALS — BP 142/70 | HR 96 | Resp 17 | Ht 65.0 in | Wt 169.0 lb

## 2020-11-20 DIAGNOSIS — E785 Hyperlipidemia, unspecified: Secondary | ICD-10-CM

## 2020-11-20 DIAGNOSIS — K900 Celiac disease: Secondary | ICD-10-CM | POA: Diagnosis not present

## 2020-11-20 DIAGNOSIS — R7309 Other abnormal glucose: Secondary | ICD-10-CM | POA: Diagnosis not present

## 2020-11-20 DIAGNOSIS — R972 Elevated prostate specific antigen [PSA]: Secondary | ICD-10-CM

## 2020-11-20 DIAGNOSIS — I10 Essential (primary) hypertension: Secondary | ICD-10-CM

## 2020-11-20 DIAGNOSIS — E034 Atrophy of thyroid (acquired): Secondary | ICD-10-CM | POA: Diagnosis not present

## 2020-11-20 LAB — COMPREHENSIVE METABOLIC PANEL
ALT: 18 U/L (ref 0–53)
AST: 17 U/L (ref 0–37)
Albumin: 4.6 g/dL (ref 3.5–5.2)
Alkaline Phosphatase: 67 U/L (ref 39–117)
BUN: 16 mg/dL (ref 6–23)
CO2: 24 mEq/L (ref 19–32)
Calcium: 9.3 mg/dL (ref 8.4–10.5)
Chloride: 110 mEq/L (ref 96–112)
Creatinine, Ser: 1.05 mg/dL (ref 0.40–1.50)
GFR: 69.15 mL/min (ref >60.00)
Glucose, Bld: 90 mg/dL (ref 70–99)
Potassium: 4.1 mEq/L (ref 3.5–5.1)
Sodium: 144 mEq/L (ref 135–145)
Total Bilirubin: 0.5 mg/dL (ref 0.2–1.2)
Total Protein: 7 g/dL (ref 6.0–8.3)

## 2020-11-20 LAB — TSH: TSH: 1.93 u[IU]/mL (ref 0.35–4.50)

## 2020-11-20 LAB — CBC
HCT: 44.4 % (ref 39.0–52.0)
Hemoglobin: 14.9 g/dL (ref 13.0–17.0)
MCHC: 33.6 g/dL (ref 30.0–36.0)
MCV: 93.9 fl (ref 78.0–100.0)
Platelets: 205 10*3/uL (ref 150.0–400.0)
RBC: 4.73 Mil/uL (ref 4.22–5.81)
RDW: 13.5 % (ref 11.5–15.5)
WBC: 7.7 10*3/uL (ref 4.0–10.5)

## 2020-11-20 LAB — HEMOGLOBIN A1C: Hgb A1c MFr Bld: 5.7 % (ref 4.6–6.5)

## 2020-11-20 LAB — LIPID PANEL
Cholesterol: 125 mg/dL (ref 0–200)
HDL: 49.5 mg/dL (ref >39.00)
LDL Cholesterol: 56 mg/dL (ref 0–99)
NonHDL: 75.37
Total CHOL/HDL Ratio: 3
Triglycerides: 95 mg/dL (ref 0.0–149.0)
VLDL: 19 mg/dL (ref 0.0–40.0)

## 2020-11-27 DIAGNOSIS — H26492 Other secondary cataract, left eye: Secondary | ICD-10-CM | POA: Diagnosis not present

## 2020-11-27 DIAGNOSIS — H40053 Ocular hypertension, bilateral: Secondary | ICD-10-CM | POA: Diagnosis not present

## 2020-11-30 ENCOUNTER — Other Ambulatory Visit: Payer: Self-pay | Admitting: Family Medicine

## 2020-11-30 DIAGNOSIS — E038 Other specified hypothyroidism: Secondary | ICD-10-CM

## 2020-12-10 ENCOUNTER — Other Ambulatory Visit: Payer: Self-pay | Admitting: Family Medicine

## 2020-12-10 DIAGNOSIS — I10 Essential (primary) hypertension: Secondary | ICD-10-CM

## 2021-02-04 ENCOUNTER — Other Ambulatory Visit: Payer: Self-pay | Admitting: Family Medicine

## 2021-02-04 DIAGNOSIS — F5101 Primary insomnia: Secondary | ICD-10-CM

## 2021-02-12 DIAGNOSIS — R972 Elevated prostate specific antigen [PSA]: Secondary | ICD-10-CM | POA: Diagnosis not present

## 2021-02-19 ENCOUNTER — Encounter: Payer: Self-pay | Admitting: Family Medicine

## 2021-02-19 DIAGNOSIS — J3089 Other allergic rhinitis: Secondary | ICD-10-CM | POA: Diagnosis not present

## 2021-02-19 DIAGNOSIS — J301 Allergic rhinitis due to pollen: Secondary | ICD-10-CM | POA: Diagnosis not present

## 2021-02-19 DIAGNOSIS — J4530 Mild persistent asthma, uncomplicated: Secondary | ICD-10-CM | POA: Diagnosis not present

## 2021-02-19 DIAGNOSIS — H1045 Other chronic allergic conjunctivitis: Secondary | ICD-10-CM | POA: Diagnosis not present

## 2021-02-21 NOTE — Progress Notes (Signed)
[place] at [facility] [address] [place], [place] [address] [phone]  Date:  02/22/2021   Name:  Shawn Santos   DOB:  [birth year]   MRN:  [id number]  PCP:  Darreld Mclean, MD    Chief Complaint: Hip Injection (Left hip)   History of Present Illness:  Shawn Santos is a 75 y.o. very pleasant male patient who presents with the following:  Shawn Santos is here today for possible GT bursa injection for painful hip-  history of hypertension, celiac sprue, hypothyroidism, hyperlipidemia, anemia, proteinuria  At our last visit in November her was dealing with elevated PSA with his urologist Dr Diona Fanti, they were thinking of doing an MRI  He is having some left hip pain- this makes it harder for him to walk for exercise  His hip has hurt for about a year No fall or other injury His hip pain is not acute, but is annoying enough that he would like to try an injection for greater trochanteric bursitis.  We discussed this at a previous visit   Patient Active Problem List   Diagnosis Date Noted  . Mixed hyperlipidemia 12/05/2014  . Bloating 09/16/2014  . Proteinuria 05/30/2014  . Eosinophilia 02/01/2013  . Elevated PSA 02/01/2013  . HTN (hypertension) 07/27/2012  . Insomnia 07/27/2012  . Hypothyroid 07/27/2012  . ANEMIA, IRON DEFICIENCY 04/18/2008  . CELIAC SPRUE 04/18/2008  . DUODENITIS, WITHOUT HEMORRHAGE 06/02/2007    Past Medical History:  Diagnosis Date  . Allergy   . Asthma   . Celiac disease   . Chronic kidney disease    nephrotic syndrome  . Hyperlipidemia   . Hypertension   . Insomnia   . PSA elevation   . Thyroid disease     Past Surgical History:  Procedure Laterality Date  . COLONOSCOPY    . EYE SURGERY     cataract- both eyes  . UPPER GASTROINTESTINAL ENDOSCOPY      Social History   Tobacco Use  . Smoking status: Never Smoker  . Smokeless tobacco: Never Used  Substance Use Topics  .  Alcohol use: No    Comment: RARELY BEER  . Drug use: No    Family History  Problem Relation Age of Onset  . Alcohol abuse Mother   . COPD Father   . Diabetes Son   . Colon cancer Neg Hx   . Esophageal cancer Neg Hx   . Rectal cancer Neg Hx   . Stomach cancer Neg Hx     No Known Allergies  Medication list has been reviewed and updated.  Current Outpatient Medications on File Prior to Visit  Medication Sig Dispense Refill  . amLODipine (NORVASC) 2.5 MG tablet Take 1 tablet (2.5 mg total) by mouth daily. 90 tablet 3  . amLODipine (NORVASC) 5 MG tablet TAKE 1 TABLET BY MOUTH EVERY DAY 90 tablet 3  . aspirin 81 MG tablet Take 81 mg by mouth daily. Reported on 06/19/2016    . budesonide-formoterol (SYMBICORT) 80-4.5 MCG/ACT inhaler Inhale 2 puffs into the lungs 2 (two) times daily.    . colchicine 0.6 MG tablet Take 2 pills for gout attack, then 1 pill an hour later 15 tablet 1  . fluticasone (FLONASE) 50 MCG/ACT nasal spray Place 2 sprays into both nostrils daily. 16 g 6  . levocetirizine (XYZAL) 5 MG tablet Take 1 tablet by mouth daily.  4  . levothyroxine (SYNTHROID) 75 MCG tablet  TAKE 1 TABLET BY MOUTH EVERY DAY BEFORE BREAKFAST 90 tablet 1  . losartan (COZAAR) 50 MG tablet     . rosuvastatin (CRESTOR) 20 MG tablet TAKE 1 TABLET BY MOUTH EVERY DAY 90 tablet 3  . traZODone (DESYREL) 50 MG tablet TAKE 1/2-1 TABLET (25-50 MG TOTAL) BY MOUTH AT BEDTIME AS NEEDED FOR SLEEP. 90 tablet 3   No current facility-administered medications on file prior to visit.    Review of Systems:  As per HPI- otherwise negative.   Physical Examination: Vitals:   02/22/21 1101  BP: 136/82  Pulse: 85  Resp: 17  Temp: 97.8 F (36.6 C)  SpO2: 97%   Vitals:   02/22/21 1101  Weight: 167 lb (75.8 kg)  Height: 5\' 5"  (1.651 m)   Body mass index is 27.79 kg/m. Ideal Body Weight: Weight in (lb) to have BMI = 25: 149.9  GEN: no acute distress.  Normal weight, looks well HEENT: Atraumatic,  Normocephalic.  Ears and Nose: No external deformity. CV: RRR, No M/G/R. No JVD. No thrill. No extra heart sounds. PULM: CTA B, no wheezes, crackles, rhonchi. No retractions. No resp. distress. No accessory muscle use. ABD: S, NT, ND EXTR: No c/c/e PSYCH: Normally interactive. Conversant.  Left hip: Tender over the left greater trochanter.  No redness, swelling or skin lesion noted.  Skin exam is otherwise normal, no pain with flexion, internal or external rotation.  Discussed risks, benefits, alternatives to injection with patient.  He verbally consents to the procedure. Prepared in one syringe: 40 mg Depo-Medrol 4 mils 1% lidocaine plain  Left hip prepped with Betadine and alcohol swabs.  Injected Depo-Medrol lidocaine as above into the area of greater trochanteric bursa.  Patient tolerated well with no immediate complications Assessment and Plan: Greater trochanteric bursitis of left hip - Plan: methylPREDNISolone acetate (DEPO-MEDROL) injection 40 mg, DISCONTINUED: methylPREDNISolone acetate (DEPO-MEDROL) injection 40 mg  Injection for greater trochanteric bursitis.  I have asked Shawn Santos to let me know how much improvement he gets from this procedure.  He will certainly let us know if any problems such as increasing pain, or any signs of infection.  This visit occurred during the SARS-CoV-2 public health emergency.  Safety protocols were in place, including screening questions prior to the visit, additional usage of staff PPE, and extensive cleaning of exam room while observing appropriate contact time as indicated for disinfecting solutions.    Signed Lamar Blinks, MD

## 2021-02-22 ENCOUNTER — Encounter: Payer: Self-pay | Admitting: Family Medicine

## 2021-02-22 ENCOUNTER — Other Ambulatory Visit: Payer: Self-pay

## 2021-02-22 ENCOUNTER — Ambulatory Visit: Payer: Medicare PPO | Admitting: Family Medicine

## 2021-02-22 VITALS — BP 136/82 | HR 85 | Temp 97.8°F | Resp 17 | Ht 65.0 in | Wt 167.0 lb

## 2021-02-22 DIAGNOSIS — M7062 Trochanteric bursitis, left hip: Secondary | ICD-10-CM

## 2021-02-22 MED ORDER — METHYLPREDNISOLONE ACETATE 40 MG/ML IJ SUSP
40.0000 mg | Freq: Once | INTRAMUSCULAR | Status: AC
  Administered 2021-02-22: 40 mg via INTRA_ARTICULAR

## 2021-02-22 MED ORDER — METHYLPREDNISOLONE ACETATE 40 MG/ML IJ SUSP: 40.0000 mg | Freq: Once | INTRAMUSCULAR | Status: DC

## 2021-02-22 NOTE — Patient Instructions (Signed)
Great to see you today as always We did a left greater trochanteric bursal injection for you today with steroid and lidocaine. I hope that this will give you some pain relief- please let me know you do over the next few days and weeks

## 2021-03-01 ENCOUNTER — Encounter: Payer: Self-pay | Admitting: Family Medicine

## 2021-03-14 ENCOUNTER — Encounter: Payer: Self-pay | Admitting: Family Medicine

## 2021-03-18 ENCOUNTER — Other Ambulatory Visit: Payer: Self-pay | Admitting: Family Medicine

## 2021-03-18 DIAGNOSIS — I10 Essential (primary) hypertension: Secondary | ICD-10-CM

## 2021-03-20 ENCOUNTER — Encounter: Payer: Self-pay | Admitting: Family Medicine

## 2021-03-28 ENCOUNTER — Encounter: Payer: Self-pay | Admitting: Family Medicine

## 2021-03-28 DIAGNOSIS — I10 Essential (primary) hypertension: Secondary | ICD-10-CM

## 2021-03-28 MED ORDER — AMLODIPINE BESYLATE 5 MG PO TABS: 5.0000 mg | ORAL_TABLET | Freq: Every day | ORAL | 3 refills | Status: DC

## 2021-03-28 NOTE — Addendum Note (Signed)
Addended by: Lamar Blinks C on: 03/28/2021 08:14 PM   Modules accepted: Orders

## 2021-05-19 NOTE — Progress Notes (Deleted)
[place] at [facility] [address] [place], [place] [address] [phone]  Date:  05/21/2021   Name:  Shawn Santos   DOB:  02-Oct-1946   MRN:  [id number]  PCP:  Darreld Mclean, MD    Chief Complaint: No chief complaint on file.   History of Present Illness:  Shawn Santos is a 75 y.o. 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 Eulogio Ditch with [facility] on a regular basis for follow-up with elevated PSA  Lab Results  Component Value Date   TSH 1.93 11/20/2020    Patient Active Problem List   Diagnosis Date Noted  . Mixed hyperlipidemia 12/05/2014  . Bloating 09/16/2014  . Proteinuria 05/30/2014  . Eosinophilia 02/01/2013  . Elevated PSA 02/01/2013  . HTN (hypertension) 07/27/2012  . Insomnia 07/27/2012  . Hypothyroid 07/27/2012  . ANEMIA, IRON DEFICIENCY 04/18/2008  . CELIAC SPRUE 04/18/2008  . DUODENITIS, WITHOUT HEMORRHAGE 06/02/2007    Past Medical History:  Diagnosis Date  . Allergy   . Asthma   . Celiac disease   . Chronic kidney disease    nephrotic syndrome  . Hyperlipidemia   . Hypertension   . Insomnia   . PSA elevation   . Thyroid disease     Past Surgical History:  Procedure Laterality Date  . COLONOSCOPY    . EYE SURGERY     cataract- both eyes  . UPPER GASTROINTESTINAL ENDOSCOPY      Social History   Tobacco Use  . Smoking status: Never Smoker  . Smokeless tobacco:  Never Used  Substance Use Topics  . Alcohol use: No    Comment: RARELY BEER  . Drug use: No    Family History  Problem Relation Age of Onset  . Alcohol abuse Mother   . COPD Father   . Diabetes Son   . Colon cancer Neg Hx   . Esophageal cancer Neg Hx   . Rectal cancer Neg Hx   . Stomach cancer Neg Hx     No Known Allergies  Medication list has been reviewed and updated.  Current Outpatient Medications on File Prior to Visit  Medication Sig Dispense Refill  . amLODipine (NORVASC) 5 MG tablet Take 1 tablet (5 mg total) by mouth daily. 90 tablet 3  . aspirin 81 MG tablet Take 81 mg by mouth daily. Reported on 06/19/2016    . budesonide-formoterol (SYMBICORT) 80-4.5 MCG/ACT inhaler Inhale 2 puffs into the lungs 2 (two) times daily.    . colchicine 0.6 MG tablet Take 2 pills for gout attack, then 1 pill an hour later 15 tablet 1  . fluticasone (FLONASE) 50 MCG/ACT nasal spray Place 2 sprays into both nostrils daily. 16 g 6  . levocetirizine (XYZAL) 5 MG tablet Take 1 tablet by mouth daily.  4  . levothyroxine (SYNTHROID) 75 MCG tablet TAKE 1 TABLET BY MOUTH EVERY DAY BEFORE BREAKFAST 90 tablet 1  . losartan (COZAAR) 50 MG  tablet     . rosuvastatin (CRESTOR) 20 MG tablet TAKE 1 TABLET BY MOUTH EVERY DAY 90 tablet 3  . traZODone (DESYREL) 50 MG tablet TAKE 1/2-1 TABLET (25-50 MG TOTAL) BY MOUTH AT BEDTIME AS NEEDED FOR SLEEP. 90 tablet 3   No current facility-administered medications on file prior to visit.    Review of Systems:  As per HPI- otherwise negative.   Physical Examination: There were no vitals filed for this visit. There were no vitals filed for this visit. There is no height or weight on file to calculate BMI. Ideal Body Weight:    GEN: no acute distress. HEENT: Atraumatic, Normocephalic.  Ears and Nose: No external deformity. CV: RRR, No M/G/R. No JVD. No thrill. No extra heart sounds. PULM: CTA B, no wheezes, crackles, rhonchi. No retractions. No resp.  distress. No accessory muscle use. ABD: S, NT, ND, +BS. No rebound. No HSM. EXTR: No c/c/e PSYCH: Normally interactive. Conversant.    Assessment and Plan: *** This visit occurred during the SARS-CoV-2 public health emergency.  Safety protocols were in place, including screening questions prior to the visit, additional usage of staff PPE, and extensive cleaning of exam room while observing appropriate contact time as indicated for disinfecting solutions.    Signed Lamar Blinks, MD

## 2021-05-20 NOTE — Patient Instructions (Addendum)
It was great to see you again today!  Assuming all is well, please see me in about 6 months You can use the colchicine if needed for out attack- hold your Crestor for 1-2 days after colchicine use, they don't interact well together!  You can get your 4th dose of covid 19 today if you like!

## 2021-05-21 ENCOUNTER — Ambulatory Visit: Payer: Medicare PPO | Attending: Internal Medicine

## 2021-05-21 ENCOUNTER — Ambulatory Visit: Payer: Medicare PPO | Admitting: Family Medicine

## 2021-05-21 ENCOUNTER — Encounter: Payer: Self-pay | Admitting: Family Medicine

## 2021-05-21 ENCOUNTER — Other Ambulatory Visit: Payer: Self-pay

## 2021-05-21 VITALS — BP 142/82 | HR 96 | Temp 98.1°F | Resp 17 | Ht 65.0 in | Wt 164.0 lb

## 2021-05-21 DIAGNOSIS — E034 Atrophy of thyroid (acquired): Secondary | ICD-10-CM

## 2021-05-21 DIAGNOSIS — M10079 Idiopathic gout, unspecified ankle and foot: Secondary | ICD-10-CM | POA: Diagnosis not present

## 2021-05-21 DIAGNOSIS — I10 Essential (primary) hypertension: Secondary | ICD-10-CM

## 2021-05-21 DIAGNOSIS — Z23 Encounter for immunization: Secondary | ICD-10-CM | POA: Diagnosis not present

## 2021-05-21 DIAGNOSIS — E785 Hyperlipidemia, unspecified: Secondary | ICD-10-CM | POA: Diagnosis not present

## 2021-05-21 LAB — COMPREHENSIVE METABOLIC PANEL
ALT: 22 U/L (ref 0–53)
AST: 20 U/L (ref 0–37)
Albumin: 4.5 g/dL (ref 3.5–5.2)
Alkaline Phosphatase: 70 U/L (ref 39–117)
BUN: 15 mg/dL (ref 6–23)
CO2: 24 mEq/L (ref 19–32)
Calcium: 9.6 mg/dL (ref 8.4–10.5)
Chloride: 107 mEq/L (ref 96–112)
Creatinine, Ser: 1.28 mg/dL (ref 0.40–1.50)
GFR: 54.33 mL/min — ABNORMAL LOW (ref >60.00)
Glucose, Bld: 114 mg/dL — ABNORMAL HIGH (ref 70–99)
Potassium: 4.4 mEq/L (ref 3.5–5.1)
Sodium: 143 mEq/L (ref 135–145)
Total Bilirubin: 0.8 mg/dL (ref 0.2–1.2)
Total Protein: 7.1 g/dL (ref 6.0–8.3)

## 2021-05-21 LAB — CBC
HCT: 46 % (ref 39.0–52.0)
Hemoglobin: 15.3 g/dL (ref 13.0–17.0)
MCHC: 33.2 g/dL (ref 30.0–36.0)
MCV: 93.3 fl (ref 78.0–100.0)
Platelets: 223 10*3/uL (ref 150.0–400.0)
RBC: 4.93 Mil/uL (ref 4.22–5.81)
RDW: 13.7 % (ref 11.5–15.5)
WBC: 10.2 10*3/uL (ref 4.0–10.5)

## 2021-05-21 LAB — LIPID PANEL
Cholesterol: 129 mg/dL (ref 0–200)
HDL: 49 mg/dL (ref >39.00)
LDL Cholesterol: 56 mg/dL (ref 0–99)
NonHDL: 79.63
Total CHOL/HDL Ratio: 3
Triglycerides: 120 mg/dL (ref 0.0–149.0)
VLDL: 24 mg/dL (ref 0.0–40.0)

## 2021-05-21 LAB — TSH: TSH: 2.56 u[IU]/mL (ref 0.35–4.50)

## 2021-05-21 MED ORDER — COLCHICINE 0.6 MG PO TABS: ORAL_TABLET | ORAL | 1 refills | Status: DC

## 2021-05-21 NOTE — Progress Notes (Addendum)
[place] at [facility], [place], [place] [address] [phone]  Date:  05/21/2021   Name:  Shawn Santos   DOB:  04/12/1946   MRN:  [id number]  PCP:  Darreld Mclean, MD    Chief Complaint: Hypertension   History of Present Illness:  Shawn Santos is a 75 y.o. 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 Eulogio Ditch with [facility] on a regular basis for follow-up with elevated PSA  His home BP may run mid to high 130s/80- it is pretty consistent for him   He is feeling well- no concerns  Staying busy with family and other activities  No tachycardia today.  Patient states he has no shortness of breath or chest pain Improved on recheck Pulse Readings from Last 3 Encounters:  05/21/21 (!) 116  02/22/21 85  11/20/20 96     Lab Results  Component Value Date   TSH 1.93 11/20/2020    Patient Active Problem List   Diagnosis Date Noted  . Mixed hyperlipidemia 12/05/2014  . Bloating 09/16/2014  . Proteinuria 05/30/2014  . Eosinophilia 02/01/2013  . Elevated PSA 02/01/2013  . HTN (hypertension) 07/27/2012  . Insomnia 07/27/2012  . Hypothyroid 07/27/2012  . ANEMIA, IRON DEFICIENCY 04/18/2008  . CELIAC SPRUE 04/18/2008  . DUODENITIS, WITHOUT HEMORRHAGE 06/02/2007    Past Medical History:  Diagnosis Date  . Allergy   . Asthma   . Celiac disease   . Chronic kidney disease     nephrotic syndrome  . Hyperlipidemia   . Hypertension   . Insomnia   . PSA elevation   . Thyroid disease     Past Surgical History:  Procedure Laterality Date  . COLONOSCOPY    . EYE SURGERY     cataract- both eyes  . UPPER GASTROINTESTINAL ENDOSCOPY      Social History   Tobacco Use  . Smoking status: Never Smoker  . Smokeless tobacco: Never Used  Substance Use Topics  . Alcohol use: No    Comment: RARELY BEER  . Drug use: No    Family History  Problem Relation Age of Onset  . Alcohol abuse Mother   . COPD Father   . Diabetes Son   . Colon cancer Neg Hx   . Esophageal cancer Neg Hx   . Rectal cancer Neg Hx   . Stomach cancer Neg Hx     No Known Allergies  Medication list has been reviewed and updated.  Current Outpatient Medications on File Prior to Visit  Medication Sig Dispense Refill  . amLODipine (NORVASC) 5 MG tablet Take 1 tablet (5 mg total) by mouth daily. 90 tablet 3  . aspirin 81 MG tablet Take 81 mg by mouth daily. Reported on 06/19/2016    . budesonide-formoterol (SYMBICORT) 80-4.5 MCG/ACT inhaler Inhale 2 puffs into the lungs 2 (two) times daily.    . fluticasone (FLONASE) 50 MCG/ACT nasal spray  Place 2 sprays into both nostrils daily. 16 g 6  . levocetirizine (XYZAL) 5 MG tablet Take 1 tablet by mouth daily.  4  . levothyroxine (SYNTHROID) 75 MCG tablet TAKE 1 TABLET BY MOUTH EVERY DAY BEFORE BREAKFAST 90 tablet 1  . losartan (COZAAR) 50 MG tablet     . rosuvastatin (CRESTOR) 20 MG tablet TAKE 1 TABLET BY MOUTH EVERY DAY 90 tablet 3  . traZODone (DESYREL) 50 MG tablet TAKE 1/2-1 TABLET (25-50 MG TOTAL) BY MOUTH AT BEDTIME AS NEEDED FOR SLEEP. 90 tablet 3   No current facility-administered medications on file prior to visit.    Review of Systems:  As per HPI- otherwise negative.   Physical Examination: Vitals:   05/21/21 1036  BP: (!) 162/92  Pulse: (!) 116  Resp: 17  Temp: 98.1 F (36.7 C)  SpO2: 98%   Vitals:   05/21/21 1036   Weight: 164 lb (74.4 kg)  Height: 5\' 5"  (1.651 m)   Body mass index is 27.29 kg/m. Ideal Body Weight: Weight in (lb) to have BMI = 25: 149.9  GEN: no acute distress.  Minimal overweight, looks his normal self HEENT: Atraumatic, Normocephalic.  Ears and Nose: No external deformity. CV: RRR, No M/G/R. No JVD. No thrill. No extra heart sounds. PULM: CTA B, no wheezes, crackles, rhonchi. No retractions. No resp. distress. No accessory muscle use. ABD: S, NT, ND, +BS. No rebound. No HSM. EXTR: No c/c/e PSYCH: Normally interactive. Conversant.    Assessment and Plan: Hypertension, unspecified type - Plan: CBC, Comprehensive metabolic panel  Idiopathic gout involving toe, unspecified chronicity, unspecified laterality - Plan: colchicine 0.6 MG tablet  Hypothyroidism due to acquired atrophy of thyroid - Plan: TSH  Dyslipidemia - Plan: Lipid panel Patient today for a follow-up visit.  Blood pressure is mildly elevated, he is checking at home on a regular basis and getting good numbers.  Continue current medication regimen Refilled colchicine to use as needed for gout  Check thyroid and lipids today  Encouraged COVID-19 fourth dose  Will plan further follow- up pending labs.    This visit occurred during the SARS-CoV-2 public health emergency.  Safety protocols were in place, including screening questions prior to the visit, additional usage of staff PPE, and extensive cleaning of exam room while observing appropriate contact time as indicated for disinfecting solutions.    Signed Lamar Blinks, MD  Received his labs as below- message to pt  Results for orders placed or performed in visit on 05/21/21  CBC  Result Value Ref Range   WBC 10.2 4.0 - 10.5 K/uL   RBC 4.93 4.22 - 5.81 Mil/uL   Platelets 223.0 150.0 - 400.0 K/uL   Hemoglobin 15.3 13.0 - 17.0 g/dL   HCT 46.0 39.0 - 52.0 %   MCV 93.3 78.0 - 100.0 fl   MCHC 33.2 30.0 - 36.0 g/dL   RDW 13.7 11.5 - 15.5 %   Comprehensive metabolic panel  Result Value Ref Range   Sodium 143 135 - 145 mEq/L   Potassium 4.4 3.5 - 5.1 mEq/L   Chloride 107 96 - 112 mEq/L   CO2 24 19 - 32 mEq/L   Glucose, Bld 114 (H) 70 - 99 mg/dL   BUN 15 6 - 23 mg/dL   Creatinine, Ser 1.28 0.40 - 1.50 mg/dL   Total Bilirubin 0.8 0.2 - 1.2 mg/dL   Alkaline Phosphatase 70 39 - 117 U/L   AST 20 0 - 37 U/L   ALT 22  0 - 53 U/L   Total Protein 7.1 6.0 - 8.3 g/dL   Albumin 4.5 3.5 - 5.2 g/dL   GFR 54.33 (L) >60.00 mL/min   Calcium 9.6 8.4 - 10.5 mg/dL  Lipid panel  Result Value Ref Range   Cholesterol 129 0 - 200 mg/dL   Triglycerides 120.0 0.0 - 149.0 mg/dL   HDL 49.00 >39.00 mg/dL   VLDL 24.0 0.0 - 40.0 mg/dL   LDL Cholesterol 56 0 - 99 mg/dL   Total CHOL/HDL Ratio 3    NonHDL 79.63   TSH  Result Value Ref Range   TSH 2.56 0.35 - 4.50 uIU/mL

## 2021-05-21 NOTE — Progress Notes (Signed)
   Covid-19 Vaccination Clinic  Name:  Shawn Santos    MRN: [id number] DOB: Dec 03, 1946  05/21/2021  Mr. Nicol was observed post Covid-19 immunization for 15 minutes without incident. He was provided with Vaccine Information Sheet and instruction to access the V-Safe system.   Mr. Levesque was instructed to call 911 with any severe reactions post vaccine: Marland Kitchen Difficulty breathing  . Swelling of face and throat  . A fast heartbeat  . A bad rash all over body  . Dizziness and weakness   Immunizations Administered    Name Date Dose VIS Date Route   PFIZER Comrnaty(Gray TOP) Covid-19 Vaccine 05/21/2021 11:27 AM 0.3 mL 12/07/2020 Intramuscular   Manufacturer: [employer], [employer]   Lot: [id number]   NDC: [phone]

## 2021-05-29 ENCOUNTER — Other Ambulatory Visit (HOSPITAL_BASED_OUTPATIENT_CLINIC_OR_DEPARTMENT_OTHER): Payer: Self-pay

## 2021-05-29 MED ORDER — PFIZER-BIONT COVID-19 VAC-TRIS 30 MCG/0.3ML IM SUSP
INTRAMUSCULAR | 0 refills | Status: DC
  Filled 2021-05-29: qty 0.3, 1d supply, fill #0

## 2021-05-31 ENCOUNTER — Encounter: Payer: Self-pay | Admitting: Family Medicine

## 2021-05-31 ENCOUNTER — Other Ambulatory Visit: Payer: Self-pay | Admitting: Family Medicine

## 2021-05-31 DIAGNOSIS — M10079 Idiopathic gout, unspecified ankle and foot: Secondary | ICD-10-CM

## 2021-06-18 DIAGNOSIS — R972 Elevated prostate specific antigen [PSA]: Secondary | ICD-10-CM | POA: Diagnosis not present

## 2021-06-18 LAB — PSA: PSA: 16.9

## 2021-07-11 ENCOUNTER — Other Ambulatory Visit: Payer: Self-pay | Admitting: Family Medicine

## 2021-07-11 DIAGNOSIS — E038 Other specified hypothyroidism: Secondary | ICD-10-CM

## 2021-08-03 DIAGNOSIS — R972 Elevated prostate specific antigen [PSA]: Secondary | ICD-10-CM | POA: Diagnosis not present

## 2021-09-06 ENCOUNTER — Other Ambulatory Visit (HOSPITAL_BASED_OUTPATIENT_CLINIC_OR_DEPARTMENT_OTHER): Payer: Self-pay

## 2021-10-01 ENCOUNTER — Encounter: Payer: Self-pay | Admitting: Family Medicine

## 2021-10-08 DIAGNOSIS — J3089 Other allergic rhinitis: Secondary | ICD-10-CM | POA: Diagnosis not present

## 2021-10-08 DIAGNOSIS — H1045 Other chronic allergic conjunctivitis: Secondary | ICD-10-CM | POA: Diagnosis not present

## 2021-10-08 DIAGNOSIS — J4530 Mild persistent asthma, uncomplicated: Secondary | ICD-10-CM | POA: Diagnosis not present

## 2021-10-08 DIAGNOSIS — J301 Allergic rhinitis due to pollen: Secondary | ICD-10-CM | POA: Diagnosis not present

## 2021-10-17 DIAGNOSIS — L738 Other specified follicular disorders: Secondary | ICD-10-CM | POA: Diagnosis not present

## 2021-10-17 DIAGNOSIS — L218 Other seborrheic dermatitis: Secondary | ICD-10-CM | POA: Diagnosis not present

## 2021-10-17 DIAGNOSIS — D1801 Hemangioma of skin and subcutaneous tissue: Secondary | ICD-10-CM | POA: Diagnosis not present

## 2021-10-17 DIAGNOSIS — L570 Actinic keratosis: Secondary | ICD-10-CM | POA: Diagnosis not present

## 2021-10-17 DIAGNOSIS — L821 Other seborrheic keratosis: Secondary | ICD-10-CM | POA: Diagnosis not present

## 2021-10-17 DIAGNOSIS — L814 Other melanin hyperpigmentation: Secondary | ICD-10-CM | POA: Diagnosis not present

## 2021-10-19 ENCOUNTER — Other Ambulatory Visit: Payer: Self-pay | Admitting: Family Medicine

## 2021-10-19 DIAGNOSIS — E782 Mixed hyperlipidemia: Secondary | ICD-10-CM

## 2021-11-18 NOTE — Progress Notes (Deleted)
[place] at [facility] [address] [place], [place] [address] [phone]  Date:  11/21/2021   Name:  Shawn Santos   DOB:  Mar 03, 1946   MRN:  [id number]  PCP:  Darreld Mclean, MD    Chief Complaint: No chief complaint on file.   History of Present Illness:  Shawn Santos is a 75 y.o. 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  He is seen by [facility] for his PSA  Labs done in May  Lab Results  Component Value Date   TSH 2.56 05/21/2021      Patient Active Problem List   Diagnosis Date Noted   Mixed hyperlipidemia 12/05/2014   Bloating 09/16/2014   Proteinuria 05/30/2014   Eosinophilia 02/01/2013   Elevated PSA 02/01/2013   HTN (hypertension) 07/27/2012   Insomnia 07/27/2012   Hypothyroid 07/27/2012   ANEMIA, IRON DEFICIENCY 04/18/2008   CELIAC SPRUE 04/18/2008   DUODENITIS, WITHOUT HEMORRHAGE 06/02/2007    Past Medical History:  Diagnosis Date   Allergy    Asthma    Celiac disease    Chronic kidney disease    nephrotic syndrome   Hyperlipidemia    Hypertension    Insomnia    PSA elevation    Thyroid disease     Past Surgical History:  Procedure Laterality Date   COLONOSCOPY     EYE SURGERY     cataract- both eyes   UPPER GASTROINTESTINAL ENDOSCOPY      Social History   Tobacco Use   Smoking status: Never   Smokeless tobacco: Never  Substance Use Topics   Alcohol use: No    Comment: RARELY BEER   Drug use: No    Family History  Problem Relation Age of Onset   Alcohol abuse Mother    COPD Father    Diabetes Son    Colon cancer Neg Hx    Esophageal cancer Neg Hx    Rectal cancer Neg Hx     Stomach cancer Neg Hx     No Known Allergies  Medication list has been reviewed and updated.  Current Outpatient Medications on File Prior to Visit  Medication Sig Dispense Refill   amLODipine (NORVASC) 5 MG tablet Take 1 tablet (5 mg total) by mouth daily. 90 tablet 3   aspirin 81 MG tablet Take 81 mg by mouth daily. Reported on 06/19/2016     budesonide-formoterol (SYMBICORT) 80-4.5 MCG/ACT inhaler Inhale 2 puffs into the lungs 2 (two) times daily.     COVID-19 mRNA Vac-TriS, Pfizer, (PFIZER-BIONT COVID-19 VAC-TRIS) SUSP injection Inject into the muscle. 0.3 mL 0   fluticasone (FLONASE) 50 MCG/ACT nasal spray Place 2 sprays into both nostrils daily. 16 g 6   levocetirizine (XYZAL) 5 MG tablet Take 1 tablet by mouth daily.  4   levothyroxine (SYNTHROID) 75 MCG tablet TAKE 1 TABLET BY MOUTH EVERY DAY BEFORE BREAKFAST 90 tablet 1   losartan (COZAAR) 50 MG tablet      MITIGARE 0.6 MG CAPS TAKE 2 PILLS FOR GOUT ATTACK, THEN 1 PILL AN HOUR LATER 15 capsule 1   rosuvastatin (CRESTOR) 20 MG tablet TAKE 1 TABLET BY MOUTH EVERY DAY 90 tablet 1  traZODone (DESYREL) 50 MG tablet TAKE 1/2-1 TABLET (25-50 MG TOTAL) BY MOUTH AT BEDTIME AS NEEDED FOR SLEEP. 90 tablet 3   No current facility-administered medications on file prior to visit.    Review of Systems:  As per HPI- otherwise negative.   Physical Examination: There were no vitals filed for this visit. There were no vitals filed for this visit. There is no height or weight on file to calculate BMI. Ideal Body Weight:    GEN: no acute distress. HEENT: Atraumatic, Normocephalic.  Ears and Nose: No external deformity. CV: RRR, No M/G/R. No JVD. No thrill. No extra heart sounds. PULM: CTA B, no wheezes, crackles, rhonchi. No retractions. No resp. distress. No accessory muscle use. ABD: S, NT, ND, +BS. No rebound. No HSM. EXTR: No c/c/e PSYCH: Normally interactive. Conversant.    Assessment and Plan: ***  Signed Lamar Blinks,  MD

## 2021-11-18 NOTE — Patient Instructions (Incomplete)
Good to see you again today- assuming all is well please see me in about 6 months

## 2021-11-21 ENCOUNTER — Ambulatory Visit: Payer: Medicare PPO | Admitting: Family Medicine

## 2021-11-21 DIAGNOSIS — E034 Atrophy of thyroid (acquired): Secondary | ICD-10-CM

## 2021-11-21 DIAGNOSIS — I10 Essential (primary) hypertension: Secondary | ICD-10-CM

## 2021-11-21 DIAGNOSIS — E785 Hyperlipidemia, unspecified: Secondary | ICD-10-CM

## 2021-11-21 DIAGNOSIS — R7309 Other abnormal glucose: Secondary | ICD-10-CM

## 2021-11-27 NOTE — Patient Instructions (Addendum)
It was great to see you again today as always, I will be in touch with your labs are soon as possible.  Assuming all is well please see me in about 6 months  Please try taking 75 to 100 mg of trazodone at bedtime and let me know how this works for you- we do have other options if this does not work

## 2021-11-27 NOTE — Progress Notes (Addendum)
[facility] at [facility] [address], [place], [place] [address] [phone]  Date:  11/29/2021   Name:  Shawn Santos   DOB:  [birth year]   MRN:  [id number]  PCP:  Darreld Mclean, MD    Chief Complaint: 6 month follow up (Concerns/ questions: none/Flu shot: received already/)   History of Present Illness:  Shawn Santos is a 75 y.o. 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  He brings in a paper record of an even more recent PSA which is down slightly.  We will abstract We increased his amlodipine this year due to elevation of blood pressure  COVID-19 latest booster done  Flu shot, Shingrix, pneumonia complete Labs done in May, CMP, lipid, CBC, thyroid Most recent colonoscopy was in 2015-?  Due for follow-up- per notes good for 10 years   Amlodipine 5 Aspirin 81- pt decided to stop using this  Symbicort, Flonase, Xyzal Synthroid 75 Losartan 50 Crestor 20 Trazodone- uses for sleep  Colchicine as needed  He uses trazodone for insomnia- he goes to bed about 11, will wake up around 2 AM and have difficulty going back to sleep.  He is taking trazodone 50 mg.  He wonders if there is something else which might be more helpful for him  BP Readings from Last 3 Encounters:  11/29/21 136/76  05/21/21 (!) 142/82  02/22/21 136/82    Patient Active Problem List   Diagnosis Date Noted   Mixed hyperlipidemia 12/05/2014   Bloating 09/16/2014   Proteinuria 05/30/2014   Eosinophilia 02/01/2013   Elevated PSA 02/01/2013   HTN (hypertension) 07/27/2012   Insomnia 07/27/2012    Hypothyroid 07/27/2012   ANEMIA, IRON DEFICIENCY 04/18/2008   CELIAC SPRUE 04/18/2008   DUODENITIS, WITHOUT HEMORRHAGE 06/02/2007    Past Medical History:  Diagnosis Date   Allergy    Asthma    Celiac disease    Chronic kidney disease    nephrotic syndrome   Hyperlipidemia    Hypertension    Insomnia    PSA elevation    Thyroid disease     Past Surgical History:  Procedure Laterality Date   COLONOSCOPY     EYE SURGERY     cataract- both eyes   UPPER GASTROINTESTINAL ENDOSCOPY      Social History   Tobacco Use   Smoking status: Never   Smokeless tobacco: Never  Substance Use Topics   Alcohol use: No    Comment: RARELY BEER   Drug use: No    Family History  Problem Relation Age of Onset   Alcohol abuse Mother    COPD Father    Diabetes Son    Colon cancer Neg Hx    Esophageal cancer Neg Hx    Rectal cancer Neg Hx    Stomach cancer Neg Hx     No Known Allergies  Medication list has been reviewed and updated.  Current Outpatient Medications on File Prior to Visit  Medication Sig Dispense Refill   amLODipine (NORVASC) 5 MG tablet Take 1 tablet (5 mg total) by mouth daily. 90 tablet 3  budesonide-formoterol (SYMBICORT) 80-4.5 MCG/ACT inhaler Inhale 2 puffs into the lungs 2 (two) times daily.     fluticasone (FLONASE) 50 MCG/ACT nasal spray Place 2 sprays into both nostrils daily. 16 g 6   levocetirizine (XYZAL) 5 MG tablet Take 1 tablet by mouth daily.  4   levothyroxine (SYNTHROID) 75 MCG tablet TAKE 1 TABLET BY MOUTH EVERY DAY BEFORE BREAKFAST 90 tablet 1   losartan (COZAAR) 50 MG tablet      MITIGARE 0.6 MG CAPS TAKE 2 PILLS FOR GOUT ATTACK, THEN 1 PILL AN HOUR LATER 15 capsule 1   rosuvastatin (CRESTOR) 20 MG tablet TAKE 1 TABLET BY MOUTH EVERY DAY 90 tablet 1   traZODone (DESYREL) 50 MG tablet TAKE 1/2-1 TABLET (25-50 MG TOTAL) BY MOUTH AT BEDTIME AS NEEDED FOR SLEEP. 90 tablet 3   No current facility-administered medications on file prior to visit.     Review of Systems:  As per HPI- otherwise negative.   Physical Examination: Vitals:   11/29/21 1102  BP: 136/76  Pulse: 92  Resp: 18  Temp: 97.9 F (36.6 C)  SpO2: 97%   Vitals:   11/29/21 1102  Weight: 162 lb 12.8 oz (73.8 kg)   Body mass index is 27.09 kg/m. Ideal Body Weight:    GEN: no acute distress.  Minimal overweight, looks well and his normal self HEENT: Atraumatic, Normocephalic.  Ears and Nose: No external deformity. CV: RRR, No M/G/R. No JVD. No thrill. No extra heart sounds. PULM: CTA B, no wheezes, crackles, rhonchi. No retractions. No resp. distress. No accessory muscle use.Marland Kitchen EXTR: No c/c/e PSYCH: Normally interactive. Conversant.    Assessment and Plan: Dyslipidemia  Hypertension, unspecified type - Plan: Basic metabolic panel  Hypothyroidism due to acquired atrophy of thyroid - Plan: TSH  Idiopathic gout involving toe, unspecified chronicity, unspecified laterality  Elevated glucose - Plan: Hemoglobin A1c  Primary insomnia  Patient seen today for follow-up.  Labs are pending as above to monitor history of hyponatremia, hypertension and hypothyroidism.  We will check A1c Discussed insomnia.  I explained there are other medications we can use, but some are higher risk especially in older persons, others are not preferred by his insurance.  For the time being we will try increasing his dose of trazodone to 75 or 100mg .  I have asked him to let me know if this does not solve his problem  Signed Lamar Blinks, MD Received his labs as below, message to patient  Results for orders placed or performed in visit on 58/85/02  Basic metabolic panel  Result Value Ref Range   Sodium 141 135 - 145 mEq/L   Potassium 4.7 3.5 - 5.1 mEq/L   Chloride 105 96 - 112 mEq/L   CO2 28 19 - 32 mEq/L   Glucose, Bld 103 (H) 70 - 99 mg/dL   BUN 13 6 - 23 mg/dL   Creatinine, Ser 1.27 0.40 - 1.50 mg/dL   GFR 54.64 (L) >60.00 mL/min   Calcium 9.7 8.4 - 10.5 mg/dL   TSH  Result Value Ref Range   TSH 2.75 0.35 - 5.50 uIU/mL  Hemoglobin A1c  Result Value Ref Range   Hgb A1c MFr Bld 5.8 4.6 - 6.5 %

## 2021-11-29 ENCOUNTER — Encounter: Payer: Self-pay | Admitting: Family Medicine

## 2021-11-29 ENCOUNTER — Ambulatory Visit (INDEPENDENT_AMBULATORY_CARE_PROVIDER_SITE_OTHER): Payer: Medicare PPO | Admitting: Family Medicine

## 2021-11-29 VITALS — BP 136/76 | HR 92 | Temp 97.9°F | Resp 18 | Wt 162.8 lb

## 2021-11-29 DIAGNOSIS — E034 Atrophy of thyroid (acquired): Secondary | ICD-10-CM

## 2021-11-29 DIAGNOSIS — M10079 Idiopathic gout, unspecified ankle and foot: Secondary | ICD-10-CM | POA: Diagnosis not present

## 2021-11-29 DIAGNOSIS — F5101 Primary insomnia: Secondary | ICD-10-CM

## 2021-11-29 DIAGNOSIS — I10 Essential (primary) hypertension: Secondary | ICD-10-CM | POA: Diagnosis not present

## 2021-11-29 DIAGNOSIS — E785 Hyperlipidemia, unspecified: Secondary | ICD-10-CM | POA: Diagnosis not present

## 2021-11-29 DIAGNOSIS — R7309 Other abnormal glucose: Secondary | ICD-10-CM

## 2021-11-29 LAB — BASIC METABOLIC PANEL
BUN: 13 mg/dL (ref 6–23)
CO2: 28 mEq/L (ref 19–32)
Calcium: 9.7 mg/dL (ref 8.4–10.5)
Chloride: 105 mEq/L (ref 96–112)
Creatinine, Ser: 1.27 mg/dL (ref 0.40–1.50)
GFR: 54.64 mL/min — ABNORMAL LOW (ref >60.00)
Glucose, Bld: 103 mg/dL — ABNORMAL HIGH (ref 70–99)
Potassium: 4.7 mEq/L (ref 3.5–5.1)
Sodium: 141 mEq/L (ref 135–145)

## 2021-11-29 LAB — TSH: TSH: 2.75 u[IU]/mL (ref 0.35–5.50)

## 2021-11-29 LAB — HEMOGLOBIN A1C: Hgb A1c MFr Bld: 5.8 % (ref 4.6–6.5)

## 2021-11-29 MED ORDER — TRAZODONE HCL 50 MG PO TABS: ORAL_TABLET | ORAL | 3 refills | Status: DC

## 2021-12-26 ENCOUNTER — Encounter: Payer: Self-pay | Admitting: Family Medicine

## 2022-01-02 ENCOUNTER — Other Ambulatory Visit: Payer: Self-pay | Admitting: Family Medicine

## 2022-01-02 DIAGNOSIS — E038 Other specified hypothyroidism: Secondary | ICD-10-CM

## 2022-01-07 ENCOUNTER — Encounter: Payer: Self-pay | Admitting: Family Medicine

## 2022-01-07 DIAGNOSIS — N1831 Chronic kidney disease, stage 3a: Secondary | ICD-10-CM | POA: Diagnosis not present

## 2022-01-15 DIAGNOSIS — N049 Nephrotic syndrome with unspecified morphologic changes: Secondary | ICD-10-CM | POA: Diagnosis not present

## 2022-01-15 DIAGNOSIS — I10 Essential (primary) hypertension: Secondary | ICD-10-CM | POA: Diagnosis not present

## 2022-01-23 DIAGNOSIS — R972 Elevated prostate specific antigen [PSA]: Secondary | ICD-10-CM | POA: Diagnosis not present

## 2022-01-23 LAB — PSA: PSA: 13.4

## 2022-01-30 DIAGNOSIS — R972 Elevated prostate specific antigen [PSA]: Secondary | ICD-10-CM | POA: Diagnosis not present

## 2022-01-30 DIAGNOSIS — N400 Enlarged prostate without lower urinary tract symptoms: Secondary | ICD-10-CM | POA: Diagnosis not present

## 2022-02-04 DIAGNOSIS — H26492 Other secondary cataract, left eye: Secondary | ICD-10-CM | POA: Diagnosis not present

## 2022-02-04 DIAGNOSIS — H40053 Ocular hypertension, bilateral: Secondary | ICD-10-CM | POA: Diagnosis not present

## 2022-03-12 ENCOUNTER — Other Ambulatory Visit: Payer: Self-pay | Admitting: Family Medicine

## 2022-03-12 DIAGNOSIS — I10 Essential (primary) hypertension: Secondary | ICD-10-CM

## 2022-03-16 ENCOUNTER — Other Ambulatory Visit: Payer: Self-pay | Admitting: Family Medicine

## 2022-03-16 DIAGNOSIS — I129 Hypertensive chronic kidney disease with stage 1 through stage 4 chronic kidney disease, or unspecified chronic kidney disease: Secondary | ICD-10-CM

## 2022-04-12 ENCOUNTER — Other Ambulatory Visit: Payer: Self-pay | Admitting: Family Medicine

## 2022-04-12 DIAGNOSIS — E782 Mixed hyperlipidemia: Secondary | ICD-10-CM

## 2022-04-24 DIAGNOSIS — J3089 Other allergic rhinitis: Secondary | ICD-10-CM | POA: Diagnosis not present

## 2022-04-24 DIAGNOSIS — J4530 Mild persistent asthma, uncomplicated: Secondary | ICD-10-CM | POA: Diagnosis not present

## 2022-04-24 DIAGNOSIS — J301 Allergic rhinitis due to pollen: Secondary | ICD-10-CM | POA: Diagnosis not present

## 2022-04-24 DIAGNOSIS — H1045 Other chronic allergic conjunctivitis: Secondary | ICD-10-CM | POA: Diagnosis not present

## 2022-06-01 NOTE — Progress Notes (Addendum)
[place] at [facility] [place], [place], [place] [address] [phone]  Date:  06/03/2022   Name:  Shawn Santos   DOB:  Jul 16, 1946   MRN:  [id number]  PCP:  Darreld Mclean, MD    Chief Complaint: 6 month follow up (Concerns/ questions: pt says his R foot is swollen from a fall he had 12 days ago. /)   History of Present Illness:  Shawn Santos is a 76 y.o. very pleasant male patient who presents with the following:  Patient seen today for periodic follow-up-  history of hypertension, celiac sprue, hypothyroidism, hyperlipidemia, anemia, proteinuria Most recent visit with myself was in December  He sees urology for PSA follow-up- he brings in a recent PSA level from January which we will abstract.  His level had come down slightly  Last visit he was having a bit more trouble with sleep.  Recommended he try increasing trazodone from 50-75 or 100 mg He notes that this did help and he is sleeping better His nephrologist increased his losartan from 50- 100 mg   Labs done in December-BMP, A1c, TSH only He fell and scraped his elbow and sprained his right ankle at a baseball game about 12 days ago.  He was able to walk on his ankle right away He was walking down some steps and tripped, fell No LOC He has been walking 4-5 miles a day until he hurt his ankle- this cut down on his walking temporarily  He is currently doing about 1 mile a day He does like his elbow is fine, does not hurt.  His ankle is getting better, it was significantly swollen and painful but is improving steadily  Patient Active Problem List   Diagnosis Date Noted   Mixed hyperlipidemia 12/05/2014   Bloating 09/16/2014   Proteinuria 05/30/2014   Eosinophilia 02/01/2013   Elevated PSA 02/01/2013   HTN (hypertension) 07/27/2012   Insomnia 07/27/2012   Hypothyroid 07/27/2012   ANEMIA, IRON DEFICIENCY 04/18/2008   CELIAC SPRUE 04/18/2008   DUODENITIS,  WITHOUT HEMORRHAGE 06/02/2007    Past Medical History:  Diagnosis Date   Allergy    Asthma    Celiac disease    Chronic kidney disease    nephrotic syndrome   Hyperlipidemia    Hypertension    Insomnia    PSA elevation    Thyroid disease     Past Surgical History:  Procedure Laterality Date   COLONOSCOPY     EYE SURGERY     cataract- both eyes   UPPER GASTROINTESTINAL ENDOSCOPY      Social History   Tobacco Use   Smoking status: Never   Smokeless tobacco: Never  Substance Use Topics   Alcohol use: No    Comment: RARELY BEER   Drug use: No    Family History  Problem Relation Age of Onset   Alcohol abuse Mother    COPD Father    Diabetes Son    Colon cancer Neg Hx    Esophageal cancer Neg Hx    Rectal cancer Neg Hx    Stomach cancer Neg Hx     No Known Allergies  Medication list has been reviewed and updated.  Current Outpatient Medications on File Prior to Visit  Medication Sig Dispense Refill   amLODipine (NORVASC) 5 MG tablet TAKE 1 TABLET (5 MG TOTAL) BY MOUTH DAILY. 90 tablet 1   budesonide-formoterol (SYMBICORT) 80-4.5 MCG/ACT inhaler Inhale  2 puffs into the lungs 2 (two) times daily.     fluticasone (FLONASE) 50 MCG/ACT nasal spray Place 2 sprays into both nostrils daily. 16 g 6   levocetirizine (XYZAL) 5 MG tablet Take 1 tablet by mouth daily.  4   levothyroxine (SYNTHROID) 75 MCG tablet TAKE 1 TABLET BY MOUTH EVERY DAY BEFORE BREAKFAST 90 tablet 1   losartan (COZAAR) 50 MG tablet TAKE 1 TABLET BY MOUTH EVERY DAY (Patient taking differently: 100 mg.) 90 tablet 3   MITIGARE 0.6 MG CAPS TAKE 2 PILLS FOR GOUT ATTACK, THEN 1 PILL AN HOUR LATER 15 capsule 1   rosuvastatin (CRESTOR) 20 MG tablet TAKE 1 TABLET BY MOUTH EVERY DAY 90 tablet 1   traZODone (DESYREL) 50 MG tablet Take 50 - 100 mg at bedtime as needed for sleep 180 tablet 3   No current facility-administered medications on file prior to visit.    Review of Systems:  As per HPI- otherwise  negative.   Physical Examination: Vitals:   06/03/22 1055  BP: (!) 154/90  Pulse: 90  Resp: 18  Temp: 97.8 F (36.6 C)  SpO2: 99%   Vitals:   06/03/22 1055  Weight: 161 lb 3.2 oz (73.1 kg)  Height: '5\' 5"'$  (1.651 m)   Body mass index is 26.83 kg/m. Ideal Body Weight: Weight in (lb) to have BMI = 25: 149.9  GEN: no acute distress.  Minimal overweight, looks well  HEENT: Atraumatic, Normocephalic.  Ears and Nose: No external deformity. CV: RRR, No M/G/R. No JVD. No thrill. No extra heart sounds. PULM: CTA B, no wheezes, crackles, rhonchi. No retractions. No resp. distress. No accessory muscle use. ABD: S, NT, ND, +BS. No rebound. No HSM. EXTR: No c/c/e PSYCH: Normally interactive. Conversant.  Right elbow: Healing bruise over the lateral elbow, full range of motion with no discomfort Right ankle: Minimal edema of the medial ankle is present.  No bony tenderness or bruising  Assessment and Plan: Hypothyroidism due to acquired atrophy of thyroid - Plan: TSH  Dyslipidemia - Plan: Lipid panel  Hypertension, unspecified type - Plan: CBC, Comprehensive metabolic panel  Elevated glucose - Plan: Hemoglobin A1c  Sprain of right ankle, unspecified ligament, initial encounter  Patient seen today for follow-up.  Labs are pending as above He agrees to monitor his blood pressures at home, will let me know how things look with his home cuff Discussed ankle sprain.  He does feel this is getting better-for the time being he declines PT.  He will continue to gradually increase exercise as he heals.  He will let me know if not return to normal within the next few weeks  Will plan further follow- up pending labs. Plan to follow-up in 6 months  Patient updated me with blood pressure reading at home, 130/80 Signed Lamar Blinks, MD  Received labs as below, message to patient  Results for orders placed or performed in visit on 06/03/22  CBC  Result Value Ref Range   WBC 7.8 4.0 -  10.5 K/uL   RBC 4.67 4.22 - 5.81 Mil/uL   Platelets 255.0 150.0 - 400.0 K/uL   Hemoglobin 14.7 13.0 - 17.0 g/dL   HCT 44.0 39.0 - 52.0 %   MCV 94.2 78.0 - 100.0 fl   MCHC 33.4 30.0 - 36.0 g/dL   RDW 13.3 11.5 - 15.5 %  Comprehensive metabolic panel  Result Value Ref Range   Sodium 138 135 - 145 mEq/L   Potassium 5.2 No hemolysis seen (  H) 3.5 - 5.1 mEq/L   Chloride 103 96 - 112 mEq/L   CO2 25 19 - 32 mEq/L   Glucose, Bld 97 70 - 99 mg/dL   BUN 22 6 - 23 mg/dL   Creatinine, Ser 1.41 0.40 - 1.50 mg/dL   Total Bilirubin 0.6 0.2 - 1.2 mg/dL   Alkaline Phosphatase 80 39 - 117 U/L   AST 18 0 - 37 U/L   ALT 17 0 - 53 U/L   Total Protein 7.6 6.0 - 8.3 g/dL   Albumin 4.9 3.5 - 5.2 g/dL   GFR 48.03 (L) >60.00 mL/min   Calcium 10.0 8.4 - 10.5 mg/dL  Hemoglobin A1c  Result Value Ref Range   Hgb A1c MFr Bld 5.6 4.6 - 6.5 %  Lipid panel  Result Value Ref Range   Cholesterol 142 0 - 200 mg/dL   Triglycerides 142.0 0.0 - 149.0 mg/dL   HDL 45.30 >39.00 mg/dL   VLDL 28.4 0.0 - 40.0 mg/dL   LDL Cholesterol 69 0 - 99 mg/dL   Total CHOL/HDL Ratio 3    NonHDL 97.08   TSH  Result Value Ref Range   TSH 3.62 0.35 - 5.50 uIU/mL

## 2022-06-01 NOTE — Patient Instructions (Addendum)
It was great to see you again today, I will be in touch with your labs.  Assuming all is well we will check back in about 6 months  You are likely eligible for an additional COVID booster if not done in the last 4 to 6 months  Please let me know if your ankle is not continuing to improve- we can do some PT if not getting back to normal in the next few weeks  Please keep an eye on your BP at home- goal 120- 135/70- 85 - please shoot me a message with some BP readings

## 2022-06-03 ENCOUNTER — Other Ambulatory Visit: Payer: Self-pay

## 2022-06-03 ENCOUNTER — Ambulatory Visit: Payer: Medicare PPO | Admitting: Family Medicine

## 2022-06-03 ENCOUNTER — Encounter: Payer: Self-pay | Admitting: Family Medicine

## 2022-06-03 ENCOUNTER — Ambulatory Visit: Payer: Medicare PPO | Attending: Internal Medicine

## 2022-06-03 VITALS — BP 154/90 | HR 90 | Temp 97.8°F | Resp 18 | Ht 65.0 in | Wt 161.2 lb

## 2022-06-03 DIAGNOSIS — R7309 Other abnormal glucose: Secondary | ICD-10-CM | POA: Diagnosis not present

## 2022-06-03 DIAGNOSIS — E034 Atrophy of thyroid (acquired): Secondary | ICD-10-CM | POA: Diagnosis not present

## 2022-06-03 DIAGNOSIS — E785 Hyperlipidemia, unspecified: Secondary | ICD-10-CM | POA: Diagnosis not present

## 2022-06-03 DIAGNOSIS — Z23 Encounter for immunization: Secondary | ICD-10-CM

## 2022-06-03 DIAGNOSIS — I10 Essential (primary) hypertension: Secondary | ICD-10-CM | POA: Diagnosis not present

## 2022-06-03 DIAGNOSIS — S93401A Sprain of unspecified ligament of right ankle, initial encounter: Secondary | ICD-10-CM | POA: Diagnosis not present

## 2022-06-03 LAB — COMPREHENSIVE METABOLIC PANEL
ALT: 17 U/L (ref 0–53)
AST: 18 U/L (ref 0–37)
Albumin: 4.9 g/dL (ref 3.5–5.2)
Alkaline Phosphatase: 80 U/L (ref 39–117)
BUN: 22 mg/dL (ref 6–23)
CO2: 25 mEq/L (ref 19–32)
Calcium: 10 mg/dL (ref 8.4–10.5)
Chloride: 103 mEq/L (ref 96–112)
Creatinine, Ser: 1.41 mg/dL (ref 0.40–1.50)
GFR: 48.03 mL/min — ABNORMAL LOW (ref >60.00)
Glucose, Bld: 97 mg/dL (ref 70–99)
Potassium: 5.2 mEq/L — ABNORMAL HIGH (ref 3.5–5.1)
Sodium: 138 mEq/L (ref 135–145)
Total Bilirubin: 0.6 mg/dL (ref 0.2–1.2)
Total Protein: 7.6 g/dL (ref 6.0–8.3)

## 2022-06-03 LAB — CBC
HCT: 44 % (ref 39.0–52.0)
Hemoglobin: 14.7 g/dL (ref 13.0–17.0)
MCHC: 33.4 g/dL (ref 30.0–36.0)
MCV: 94.2 fl (ref 78.0–100.0)
Platelets: 255 10*3/uL (ref 150.0–400.0)
RBC: 4.67 Mil/uL (ref 4.22–5.81)
RDW: 13.3 % (ref 11.5–15.5)
WBC: 7.8 10*3/uL (ref 4.0–10.5)

## 2022-06-03 LAB — LIPID PANEL
Cholesterol: 142 mg/dL (ref 0–200)
HDL: 45.3 mg/dL (ref >39.00)
LDL Cholesterol: 69 mg/dL (ref 0–99)
NonHDL: 97.08
Total CHOL/HDL Ratio: 3
Triglycerides: 142 mg/dL (ref 0.0–149.0)
VLDL: 28.4 mg/dL (ref 0.0–40.0)

## 2022-06-03 LAB — TSH: TSH: 3.62 u[IU]/mL (ref 0.35–5.50)

## 2022-06-03 LAB — HEMOGLOBIN A1C: Hgb A1c MFr Bld: 5.6 % (ref 4.6–6.5)

## 2022-06-03 NOTE — Progress Notes (Signed)
   Covid-19 Vaccination Clinic  Name:  Shawn Santos    MRN: [id number] DOB: 1946/07/26  06/03/2022  Mr. [name] was observed post Covid-19 immunization for 15 minutes without incident. He was provided with Vaccine Information Sheet and instruction to access the V-Safe system.   [name] was instructed to call 911 with any severe reactions post vaccine: Difficulty breathing  Swelling of face and throat  A fast heartbeat  A bad rash all over body  Dizziness and weakness   Immunizations Administered     Name Date Dose VIS Date Route   Pfizer Covid-19 Vaccine Bivalent Booster 06/03/2022 11:56 AM 0.3 mL 08/29/2021 Intramuscular   Manufacturer: [place]   Lot: [id number]   [place]: [phone]

## 2022-06-07 ENCOUNTER — Other Ambulatory Visit (HOSPITAL_BASED_OUTPATIENT_CLINIC_OR_DEPARTMENT_OTHER): Payer: Self-pay

## 2022-06-07 MED ORDER — PFIZER COVID-19 VAC BIVALENT 30 MCG/0.3ML IM SUSP
INTRAMUSCULAR | 0 refills | Status: DC
  Filled 2022-06-07: qty 0.3, 1d supply, fill #0

## 2022-06-29 ENCOUNTER — Other Ambulatory Visit: Payer: Self-pay | Admitting: Family Medicine

## 2022-06-29 DIAGNOSIS — E038 Other specified hypothyroidism: Secondary | ICD-10-CM

## 2022-07-20 ENCOUNTER — Other Ambulatory Visit: Payer: Self-pay | Admitting: Family Medicine

## 2022-07-20 DIAGNOSIS — J4530 Mild persistent asthma, uncomplicated: Secondary | ICD-10-CM

## 2022-08-23 DIAGNOSIS — R972 Elevated prostate specific antigen [PSA]: Secondary | ICD-10-CM | POA: Diagnosis not present

## 2022-08-26 LAB — PSA: PSA: 14.3

## 2022-08-28 ENCOUNTER — Telehealth: Payer: Self-pay | Admitting: Family Medicine

## 2022-08-28 NOTE — Telephone Encounter (Signed)
Lvm to schedule awv. Requested callback

## 2022-08-30 DIAGNOSIS — N400 Enlarged prostate without lower urinary tract symptoms: Secondary | ICD-10-CM | POA: Diagnosis not present

## 2022-08-30 DIAGNOSIS — R972 Elevated prostate specific antigen [PSA]: Secondary | ICD-10-CM | POA: Diagnosis not present

## 2022-09-05 ENCOUNTER — Encounter: Payer: Self-pay | Admitting: Family Medicine

## 2022-09-06 ENCOUNTER — Other Ambulatory Visit: Payer: Self-pay | Admitting: Family Medicine

## 2022-09-06 DIAGNOSIS — I10 Essential (primary) hypertension: Secondary | ICD-10-CM

## 2022-10-05 ENCOUNTER — Other Ambulatory Visit: Payer: Self-pay | Admitting: Family Medicine

## 2022-10-05 DIAGNOSIS — E782 Mixed hyperlipidemia: Secondary | ICD-10-CM

## 2022-10-17 DIAGNOSIS — D225 Melanocytic nevi of trunk: Secondary | ICD-10-CM | POA: Diagnosis not present

## 2022-10-17 DIAGNOSIS — C44519 Basal cell carcinoma of skin of other part of trunk: Secondary | ICD-10-CM | POA: Diagnosis not present

## 2022-10-17 DIAGNOSIS — L738 Other specified follicular disorders: Secondary | ICD-10-CM | POA: Diagnosis not present

## 2022-10-17 DIAGNOSIS — Z85828 Personal history of other malignant neoplasm of skin: Secondary | ICD-10-CM | POA: Diagnosis not present

## 2022-10-17 DIAGNOSIS — L814 Other melanin hyperpigmentation: Secondary | ICD-10-CM | POA: Diagnosis not present

## 2022-10-17 DIAGNOSIS — L821 Other seborrheic keratosis: Secondary | ICD-10-CM | POA: Diagnosis not present

## 2022-10-17 DIAGNOSIS — D485 Neoplasm of uncertain behavior of skin: Secondary | ICD-10-CM | POA: Diagnosis not present

## 2022-10-17 DIAGNOSIS — L570 Actinic keratosis: Secondary | ICD-10-CM | POA: Diagnosis not present

## 2022-10-17 DIAGNOSIS — L573 Poikiloderma of Civatte: Secondary | ICD-10-CM | POA: Diagnosis not present

## 2022-10-28 ENCOUNTER — Encounter: Payer: Self-pay | Admitting: Family Medicine

## 2022-10-28 DIAGNOSIS — C4490 Unspecified malignant neoplasm of skin, unspecified: Secondary | ICD-10-CM | POA: Insufficient documentation

## 2022-11-06 DIAGNOSIS — C44519 Basal cell carcinoma of skin of other part of trunk: Secondary | ICD-10-CM | POA: Diagnosis not present

## 2022-11-29 DIAGNOSIS — R7303 Prediabetes: Secondary | ICD-10-CM | POA: Insufficient documentation

## 2022-11-29 NOTE — Progress Notes (Unsigned)
[place] at [facility] [address], [place], [place] [address] [phone]  Date:  12/02/2022   Name:  Shawn Santos   DOB:  1946/01/18   MRN:  [id number]  PCP:  Darreld Mclean, MD    Chief Complaint: No chief complaint on file.   History of Present Illness:  Shawn Santos is a 76 y.o. very pleasant male patient who presents with the following:  Pt seen today for 6 month recheck Last seen by myself in June  History of hypertension, celiac sprue, hypothyroidism, hyperlipidemia, anemia, proteinuria  He sees urology for PSA   Enjoys walking for exercise   Flu shot Covid booster recommended   Amlodipine Symbicort Synthroid 75 losartan Crestor Trazodone  Needs BMP, CBC, A1c   Lab Results  Component Value Date   TSH 3.62 06/03/2022    Patient Active Problem List   Diagnosis Date Noted   Skin cancer 10/28/2022   Mixed hyperlipidemia 12/05/2014   Bloating 09/16/2014   Proteinuria 05/30/2014   Eosinophilia 02/01/2013   Elevated PSA 02/01/2013   HTN (hypertension) 07/27/2012   Insomnia 07/27/2012   Hypothyroid 07/27/2012   ANEMIA, IRON DEFICIENCY 04/18/2008   CELIAC SPRUE 04/18/2008   DUODENITIS, WITHOUT HEMORRHAGE 06/02/2007    Past Medical History:  Diagnosis Date   Allergy    Asthma    Celiac disease    Chronic kidney disease    nephrotic syndrome   Hyperlipidemia    Hypertension    Insomnia    PSA elevation    Thyroid disease     Past Surgical History:  Procedure Laterality Date   COLONOSCOPY     EYE SURGERY     cataract- both eyes   UPPER GASTROINTESTINAL ENDOSCOPY      Social History   Tobacco Use   Smoking status: Never   Smokeless tobacco: Never  Substance Use Topics   Alcohol use: No    Comment: RARELY BEER   Drug use: No    Family History  Problem Relation Age of Onset   Alcohol abuse Mother    COPD Father    Diabetes Son    Colon cancer Neg Hx    Esophageal  cancer Neg Hx    Rectal cancer Neg Hx    Stomach cancer Neg Hx     No Known Allergies  Medication list has been reviewed and updated.  Current Outpatient Medications on File Prior to Visit  Medication Sig Dispense Refill   amLODipine (NORVASC) 5 MG tablet Take 1 tablet (5 mg total) by mouth daily. 90 tablet 1   budesonide-formoterol (SYMBICORT) 80-4.5 MCG/ACT inhaler Inhale 2 puffs into the lungs 2 (two) times daily. 30.6 each 12   COVID-19 mRNA bivalent vaccine, Pfizer, (PFIZER COVID-19 VAC BIVALENT) injection Inject into the muscle. 0.3 mL 0   fluticasone (FLONASE) 50 MCG/ACT nasal spray Place 2 sprays into both nostrils daily. 16 g 6   levocetirizine (XYZAL) 5 MG tablet Take 1 tablet by mouth daily.  4   levothyroxine (SYNTHROID) 75 MCG tablet TAKE 1 TABLET BY MOUTH EVERY DAY BEFORE BREAKFAST 90 tablet 1   losartan (COZAAR) 50 MG tablet TAKE 1 TABLET BY MOUTH EVERY DAY (Patient taking differently: 100 mg.) 90 tablet 3   MITIGARE 0.6 MG CAPS TAKE 2 PILLS FOR GOUT ATTACK, THEN 1 PILL AN HOUR LATER 15 capsule 1   rosuvastatin (CRESTOR) 20 MG tablet Take 1 tablet (20 mg total) by mouth  daily. 90 tablet 0   traZODone (DESYREL) 50 MG tablet Take 50 - 100 mg at bedtime as needed for sleep 180 tablet 3   No current facility-administered medications on file prior to visit.    Review of Systems:  As per HPI- otherwise negative.   Physical Examination: There were no vitals filed for this visit. There were no vitals filed for this visit. There is no height or weight on file to calculate BMI. Ideal Body Weight:    GEN: no acute distress. HEENT: Atraumatic, Normocephalic.  Ears and Nose: No external deformity. CV: RRR, No M/G/R. No JVD. No thrill. No extra heart sounds. PULM: CTA B, no wheezes, crackles, rhonchi. No retractions. No resp. distress. No accessory muscle use. ABD: S, NT, ND, +BS. No rebound. No HSM. EXTR: No c/c/e PSYCH: Normally interactive. Conversant.    Assessment  and Plan: ***  Signed Lamar Blinks, MD

## 2022-11-29 NOTE — Patient Instructions (Incomplete)
Good to see you again today- I will be in touch with your labs  Please see me in about 6 months Stop by the imaging dept and schedule your CT coronary calcium

## 2022-12-02 ENCOUNTER — Encounter: Payer: Self-pay | Admitting: Family Medicine

## 2022-12-02 ENCOUNTER — Ambulatory Visit: Payer: Medicare PPO | Admitting: Family Medicine

## 2022-12-02 ENCOUNTER — Other Ambulatory Visit (HOSPITAL_BASED_OUTPATIENT_CLINIC_OR_DEPARTMENT_OTHER): Payer: Self-pay

## 2022-12-02 VITALS — BP 124/80 | HR 80 | Temp 97.7°F | Resp 18 | Ht 65.0 in | Wt 160.6 lb

## 2022-12-02 DIAGNOSIS — E034 Atrophy of thyroid (acquired): Secondary | ICD-10-CM

## 2022-12-02 DIAGNOSIS — E785 Hyperlipidemia, unspecified: Secondary | ICD-10-CM | POA: Diagnosis not present

## 2022-12-02 DIAGNOSIS — I10 Essential (primary) hypertension: Secondary | ICD-10-CM

## 2022-12-02 DIAGNOSIS — R7303 Prediabetes: Secondary | ICD-10-CM

## 2022-12-02 LAB — BASIC METABOLIC PANEL
BUN: 23 mg/dL (ref 6–23)
CO2: 23 mEq/L (ref 19–32)
Calcium: 9.9 mg/dL (ref 8.4–10.5)
Chloride: 104 mEq/L (ref 96–112)
Creatinine, Ser: 1.44 mg/dL (ref 0.40–1.50)
GFR: 46.66 mL/min — ABNORMAL LOW (ref >60.00)
Glucose, Bld: 86 mg/dL (ref 70–99)
Potassium: 4.6 mEq/L (ref 3.5–5.1)
Sodium: 138 mEq/L (ref 135–145)

## 2022-12-02 LAB — TSH: TSH: 3.12 u[IU]/mL (ref 0.35–5.50)

## 2022-12-02 LAB — HEMOGLOBIN A1C: Hgb A1c MFr Bld: 6 % (ref 4.6–6.5)

## 2022-12-02 LAB — CBC
HCT: 47.9 % (ref 39.0–52.0)
Hemoglobin: 15.8 g/dL (ref 13.0–17.0)
MCHC: 33 g/dL (ref 30.0–36.0)
MCV: 93.9 fl (ref 78.0–100.0)
Platelets: 244 10*3/uL (ref 150.0–400.0)
RBC: 5.1 Mil/uL (ref 4.22–5.81)
RDW: 13.3 % (ref 11.5–15.5)
WBC: 7.9 10*3/uL (ref 4.0–10.5)

## 2022-12-02 MED ORDER — COMIRNATY 30 MCG/0.3ML IM SUSY
PREFILLED_SYRINGE | INTRAMUSCULAR | 0 refills | Status: DC
  Filled 2022-12-02: qty 0.3, 1d supply, fill #0

## 2022-12-04 DIAGNOSIS — J4530 Mild persistent asthma, uncomplicated: Secondary | ICD-10-CM | POA: Diagnosis not present

## 2022-12-04 DIAGNOSIS — J3089 Other allergic rhinitis: Secondary | ICD-10-CM | POA: Diagnosis not present

## 2022-12-04 DIAGNOSIS — J301 Allergic rhinitis due to pollen: Secondary | ICD-10-CM | POA: Diagnosis not present

## 2022-12-04 DIAGNOSIS — H1045 Other chronic allergic conjunctivitis: Secondary | ICD-10-CM | POA: Diagnosis not present

## 2022-12-05 ENCOUNTER — Ambulatory Visit (HOSPITAL_BASED_OUTPATIENT_CLINIC_OR_DEPARTMENT_OTHER)
Admission: RE | Admit: 2022-12-05 | Discharge: 2022-12-05 | Disposition: A | Discharge status: Home / Self Care | Payer: Self-pay | Source: Ambulatory Visit | Attending: Family Medicine | Admitting: Family Medicine

## 2022-12-05 DIAGNOSIS — E785 Hyperlipidemia, unspecified: Secondary | ICD-10-CM | POA: Insufficient documentation

## 2022-12-05 DIAGNOSIS — I10 Essential (primary) hypertension: Secondary | ICD-10-CM | POA: Insufficient documentation

## 2022-12-06 ENCOUNTER — Encounter: Payer: Self-pay | Admitting: Family Medicine

## 2022-12-23 ENCOUNTER — Other Ambulatory Visit: Payer: Self-pay | Admitting: Family Medicine

## 2022-12-23 DIAGNOSIS — E038 Other specified hypothyroidism: Secondary | ICD-10-CM

## 2023-01-01 ENCOUNTER — Other Ambulatory Visit: Payer: Self-pay | Admitting: Family Medicine

## 2023-01-01 DIAGNOSIS — F5101 Primary insomnia: Secondary | ICD-10-CM

## 2023-01-03 ENCOUNTER — Other Ambulatory Visit: Payer: Self-pay | Admitting: Family Medicine

## 2023-01-03 DIAGNOSIS — E782 Mixed hyperlipidemia: Secondary | ICD-10-CM

## 2023-02-05 DIAGNOSIS — H40053 Ocular hypertension, bilateral: Secondary | ICD-10-CM | POA: Diagnosis not present

## 2023-02-05 DIAGNOSIS — H26492 Other secondary cataract, left eye: Secondary | ICD-10-CM | POA: Diagnosis not present

## 2023-02-10 DIAGNOSIS — N1831 Chronic kidney disease, stage 3a: Secondary | ICD-10-CM | POA: Diagnosis not present

## 2023-02-18 DIAGNOSIS — N049 Nephrotic syndrome with unspecified morphologic changes: Secondary | ICD-10-CM | POA: Diagnosis not present

## 2023-02-18 DIAGNOSIS — I10 Essential (primary) hypertension: Secondary | ICD-10-CM | POA: Diagnosis not present

## 2023-02-18 LAB — BASIC METABOLIC PANEL
BUN: 22 — AB (ref 4–21)
Creatinine: 1.3 (ref 0.6–1.3)
Sodium: 147 (ref 137–147)

## 2023-02-18 LAB — PSA: PSA: 17.2

## 2023-02-18 LAB — COMPREHENSIVE METABOLIC PANEL: eGFR: 56

## 2023-03-05 ENCOUNTER — Other Ambulatory Visit: Payer: Self-pay | Admitting: Family Medicine

## 2023-03-05 DIAGNOSIS — I10 Essential (primary) hypertension: Secondary | ICD-10-CM

## 2023-03-14 DIAGNOSIS — R972 Elevated prostate specific antigen [PSA]: Secondary | ICD-10-CM | POA: Diagnosis not present

## 2023-03-24 DIAGNOSIS — N400 Enlarged prostate without lower urinary tract symptoms: Secondary | ICD-10-CM | POA: Diagnosis not present

## 2023-03-24 DIAGNOSIS — R972 Elevated prostate specific antigen [PSA]: Secondary | ICD-10-CM | POA: Diagnosis not present

## 2023-04-02 ENCOUNTER — Other Ambulatory Visit: Payer: Self-pay | Admitting: Family Medicine

## 2023-04-02 DIAGNOSIS — E782 Mixed hyperlipidemia: Secondary | ICD-10-CM

## 2023-06-01 NOTE — Progress Notes (Unsigned)
[facility] at [facility] [address], [address] [place], [place] [address] [phone]  Date:  06/04/2023   Name:  Shawn Santos   DOB:  06/04/1946   MRN:  [id number]  PCP:  Shawn Cables, MD    Chief Complaint: No chief complaint on file.   History of Present Illness:  Shawn Santos is a 77 y.o. very pleasant male patient who presents with the following:  Pt seen today for a recheck visit Last sen by myself in December  History of hypertension, celiac sprue, hypothyroidism, hyperlipidemia, anemia, proteinuria  He sees urology for PSA  Enjoys walking several miles most day for exercise  We did a coronary calcium in December- he did well:  1. Coronary calcium score of 110. This was 30th percentile for age-, race-, and sex-matched controls.  Amlodipine 5 Levothyroxine Losartan 100 Crestor trazodone at bedtime Symbicort   Lab Results  Component Value Date   TSH 3.12 12/02/2022      Patient Active Problem List   Diagnosis Date Noted   Prediabetes 11/29/2022   Skin cancer 10/28/2022   Mixed hyperlipidemia 12/05/2014   Bloating 09/16/2014   Proteinuria 05/30/2014   Eosinophilia 02/01/2013   Elevated PSA 02/01/2013   HTN (hypertension) 07/27/2012   Insomnia 07/27/2012   Hypothyroid 07/27/2012   ANEMIA, IRON DEFICIENCY 04/18/2008   CELIAC SPRUE 04/18/2008   DUODENITIS, WITHOUT HEMORRHAGE 06/02/2007    Past Medical History:  Diagnosis Date   Allergy    Asthma    Celiac disease    Chronic kidney disease    nephrotic syndrome   Hyperlipidemia    Hypertension    Insomnia    PSA elevation    Thyroid disease     Past Surgical History:  Procedure Laterality Date   COLONOSCOPY     EYE SURGERY     cataract- both eyes   UPPER GASTROINTESTINAL ENDOSCOPY      Social History   Tobacco Use   Smoking status: Never   Smokeless tobacco: Never  Substance Use Topics   Alcohol use: No    Comment: RARELY BEER    Drug use: No    Family History  Problem Relation Age of Onset   Alcohol abuse Mother    COPD Father    Diabetes Son    Colon cancer Neg Hx    Esophageal cancer Neg Hx    Rectal cancer Neg Hx    Stomach cancer Neg Hx     No Known Allergies  Medication list has been reviewed and updated.  Current Outpatient Medications on File Prior to Visit  Medication Sig Dispense Refill   amLODipine (NORVASC) 5 MG tablet TAKE 1 TABLET (5 MG TOTAL) BY MOUTH DAILY. 90 tablet 1   budesonide-formoterol (SYMBICORT) 80-4.5 MCG/ACT inhaler Inhale 2 puffs into the lungs 2 (two) times daily. 30.6 each 12   COVID-19 mRNA bivalent vaccine, Pfizer, (PFIZER COVID-19 VAC BIVALENT) injection Inject into the muscle. 0.3 mL 0   COVID-19 mRNA vaccine 2023-2024 (COMIRNATY) syringe Inject into the muscle. 0.3 mL 0   fluticasone (FLONASE) 50 MCG/ACT nasal spray Place 2 sprays into both nostrils daily. 16 g 6   levocetirizine (XYZAL) 5 MG tablet Take 1 tablet by mouth daily.  4   levothyroxine (SYNTHROID) 75 MCG tablet TAKE 1 TABLET BY MOUTH EVERY DAY BEFORE BREAKFAST 90 tablet 1   losartan (COZAAR) 50 MG tablet TAKE 1 TABLET BY MOUTH EVERY DAY (Patient taking  differently: 100 mg.) 90 tablet 3   MITIGARE 0.6 MG CAPS TAKE 2 PILLS FOR GOUT ATTACK, THEN 1 PILL AN HOUR LATER 15 capsule 1   rosuvastatin (CRESTOR) 20 MG tablet TAKE 1 TABLET BY MOUTH EVERY DAY 90 tablet 0   traZODone (DESYREL) 50 MG tablet TAKE 50 - 100 MG AT BEDTIME AS NEEDED FOR SLEEP 180 tablet 3   No current facility-administered medications on file prior to visit.    Review of Systems:  As per HPI- otherwise negative.   Physical Examination: There were no vitals filed for this visit. There were no vitals filed for this visit. There is no height or weight on file to calculate BMI. Ideal Body Weight:    GEN: no acute distress. HEENT: Atraumatic, Normocephalic.  Ears and Nose: No external deformity. CV: RRR, No M/G/R. No JVD. No thrill. No  extra heart sounds. PULM: CTA B, no wheezes, crackles, rhonchi. No retractions. No resp. distress. No accessory muscle use. ABD: S, NT, ND, +BS. No rebound. No HSM. EXTR: No c/c/e PSYCH: Normally interactive. Conversant.    Assessment and Plan: ***  Signed Abbe Amsterdam, MD

## 2023-06-01 NOTE — Patient Instructions (Incomplete)
Good to see you again today- I will be in touch with your labs asap, please see me in about 6 months assuming all is well  Have a wonderful summer!

## 2023-06-04 ENCOUNTER — Ambulatory Visit: Payer: Medicare PPO | Admitting: Family Medicine

## 2023-06-04 VITALS — BP 138/60 | HR 78 | Temp 97.8°F | Resp 18 | Ht 65.0 in | Wt 159.2 lb

## 2023-06-04 DIAGNOSIS — I10 Essential (primary) hypertension: Secondary | ICD-10-CM

## 2023-06-04 DIAGNOSIS — E034 Atrophy of thyroid (acquired): Secondary | ICD-10-CM | POA: Diagnosis not present

## 2023-06-04 DIAGNOSIS — E785 Hyperlipidemia, unspecified: Secondary | ICD-10-CM | POA: Diagnosis not present

## 2023-06-04 DIAGNOSIS — R7303 Prediabetes: Secondary | ICD-10-CM | POA: Diagnosis not present

## 2023-06-04 DIAGNOSIS — E782 Mixed hyperlipidemia: Secondary | ICD-10-CM | POA: Diagnosis not present

## 2023-06-04 DIAGNOSIS — I129 Hypertensive chronic kidney disease with stage 1 through stage 4 chronic kidney disease, or unspecified chronic kidney disease: Secondary | ICD-10-CM

## 2023-06-04 LAB — CBC
HCT: 44.7 % (ref 39.0–52.0)
Hemoglobin: 14.6 g/dL (ref 13.0–17.0)
MCHC: 32.5 g/dL (ref 30.0–36.0)
MCV: 93.5 fl (ref 78.0–100.0)
Platelets: 228 10*3/uL (ref 150.0–400.0)
RBC: 4.78 Mil/uL (ref 4.22–5.81)
RDW: 13.8 % (ref 11.5–15.5)
WBC: 8.9 10*3/uL (ref 4.0–10.5)

## 2023-06-04 LAB — HEMOGLOBIN A1C: Hgb A1c MFr Bld: 5.8 % (ref 4.6–6.5)

## 2023-06-04 LAB — TSH: TSH: 2.9 u[IU]/mL (ref 0.35–5.50)

## 2023-06-04 MED ORDER — LOSARTAN POTASSIUM 50 MG PO TABS: 50.0000 mg | ORAL_TABLET | Freq: Every day | ORAL | 3 refills | Status: DC

## 2023-06-05 ENCOUNTER — Encounter: Payer: Self-pay | Admitting: Family Medicine

## 2023-06-05 LAB — LIPID PANEL
Cholesterol: 124 mg/dL (ref 0–200)
HDL: 45 mg/dL (ref >39.00)
LDL Cholesterol: 59 mg/dL (ref 0–99)
NonHDL: 79.15
Total CHOL/HDL Ratio: 3
Triglycerides: 103 mg/dL (ref 0.0–149.0)
VLDL: 20.6 mg/dL (ref 0.0–40.0)

## 2023-06-05 LAB — COMPREHENSIVE METABOLIC PANEL
ALT: 11 U/L (ref 0–53)
AST: 14 U/L (ref 0–37)
Albumin: 4.6 g/dL (ref 3.5–5.2)
Alkaline Phosphatase: 62 U/L (ref 39–117)
BUN: 16 mg/dL (ref 6–23)
CO2: 19 mEq/L (ref 19–32)
Calcium: 9.3 mg/dL (ref 8.4–10.5)
Chloride: 108 mEq/L (ref 96–112)
Creatinine, Ser: 1.35 mg/dL (ref 0.40–1.50)
GFR: 50.24 mL/min — ABNORMAL LOW (ref >60.00)
Glucose, Bld: 114 mg/dL — ABNORMAL HIGH (ref 70–99)
Potassium: 4.5 mEq/L (ref 3.5–5.1)
Sodium: 143 mEq/L (ref 135–145)
Total Bilirubin: 0.9 mg/dL (ref 0.2–1.2)
Total Protein: 7.2 g/dL (ref 6.0–8.3)

## 2023-06-21 ENCOUNTER — Other Ambulatory Visit: Payer: Self-pay | Admitting: Family Medicine

## 2023-06-21 DIAGNOSIS — E038 Other specified hypothyroidism: Secondary | ICD-10-CM

## 2023-07-01 ENCOUNTER — Other Ambulatory Visit: Payer: Self-pay | Admitting: Family Medicine

## 2023-07-01 DIAGNOSIS — E782 Mixed hyperlipidemia: Secondary | ICD-10-CM

## 2023-08-22 ENCOUNTER — Encounter: Payer: Self-pay | Admitting: Family Medicine

## 2023-08-27 ENCOUNTER — Other Ambulatory Visit: Payer: Self-pay | Admitting: Family Medicine

## 2023-08-27 DIAGNOSIS — I10 Essential (primary) hypertension: Secondary | ICD-10-CM

## 2023-09-03 ENCOUNTER — Other Ambulatory Visit: Payer: Self-pay | Admitting: Family Medicine

## 2023-09-03 DIAGNOSIS — J4530 Mild persistent asthma, uncomplicated: Secondary | ICD-10-CM

## 2023-09-04 DIAGNOSIS — Z77122 Contact with and (suspected) exposure to noise: Secondary | ICD-10-CM | POA: Diagnosis not present

## 2023-09-04 DIAGNOSIS — H9313 Tinnitus, bilateral: Secondary | ICD-10-CM | POA: Diagnosis not present

## 2023-09-04 DIAGNOSIS — H903 Sensorineural hearing loss, bilateral: Secondary | ICD-10-CM | POA: Diagnosis not present

## 2023-09-20 ENCOUNTER — Other Ambulatory Visit: Payer: Self-pay | Admitting: Family Medicine

## 2023-09-20 DIAGNOSIS — E038 Other specified hypothyroidism: Secondary | ICD-10-CM

## 2023-10-14 DIAGNOSIS — R972 Elevated prostate specific antigen [PSA]: Secondary | ICD-10-CM | POA: Diagnosis not present

## 2023-10-14 LAB — PSA: PSA: 15.9

## 2023-12-03 DIAGNOSIS — J3089 Other allergic rhinitis: Secondary | ICD-10-CM | POA: Diagnosis not present

## 2023-12-03 DIAGNOSIS — J4530 Mild persistent asthma, uncomplicated: Secondary | ICD-10-CM | POA: Diagnosis not present

## 2023-12-03 DIAGNOSIS — H1045 Other chronic allergic conjunctivitis: Secondary | ICD-10-CM | POA: Diagnosis not present

## 2023-12-03 DIAGNOSIS — J301 Allergic rhinitis due to pollen: Secondary | ICD-10-CM | POA: Diagnosis not present

## 2023-12-04 NOTE — Progress Notes (Signed)
[facility] [address], [address] [place], [place] [address] [phone]  Date:  12/08/2023   Name:  Shawn Santos   DOB:  [birth year]   MRN:  [id number]  PCP:  Shawn Cables, MD    Chief Complaint: 6 month follow up (Concerns/ questions: none/AWV due- overdue/Flu shot today: received at [facility])   History of Present Illness:  Shawn Santos is a 77 y.o. very pleasant male patient who presents with the following:  Patient seen today for periodic recheck Most recent visit with myself was in June History of hypertension, celiac sprue, hypothyroidism, hyperlipidemia, anemia, proteinuria, hearing loss, prediabetes He sees urology for PSA - his new urologist is Dr Gwenyth Bouillon we think They did a PSA for him in October -will abstract  He is checking BP at home- 130-140/70-80  He is retired from his work at [employer] Enjoys walking and spending time with family He continues to do quite a bit of walking, yesterday he walked 6 miles  Seasonal flu- done COVID booster -done Complete labs done in June, A1c 5.8  Amlodipine 5 Losartan 50-patient is actually taking 100 mg.  I called his pharmacy and confirmed he has been on 100 mg all along.  In any case his blood pressure looks fine on this dose Levothyroxine 75 Crestor 20 Symbicort Trazodone  Patient Active Problem List   Diagnosis Date Noted   Prediabetes 11/29/2022   Skin cancer 10/28/2022   Mixed hyperlipidemia 12/05/2014   Bloating 09/16/2014   Proteinuria 05/30/2014   Eosinophilia 02/01/2013   Elevated PSA 02/01/2013   HTN (hypertension) 07/27/2012   Insomnia 07/27/2012   Hypothyroid 07/27/2012   ANEMIA, IRON DEFICIENCY 04/18/2008   CELIAC SPRUE 04/18/2008   Duodenitis 06/02/2007    Past Medical History:  Diagnosis Date   Allergy    Asthma    Celiac disease    Chronic kidney disease    nephrotic syndrome   Hyperlipidemia    Hypertension    Insomnia    PSA  elevation    Thyroid disease     Past Surgical History:  Procedure Laterality Date   COLONOSCOPY     EYE SURGERY     cataract- both eyes   UPPER GASTROINTESTINAL ENDOSCOPY      Social History   Tobacco Use   Smoking status: Never   Smokeless tobacco: Never  Substance Use Topics   Alcohol use: No    Comment: RARELY BEER   Drug use: No    Family History  Problem Relation Age of Onset   Alcohol abuse Mother    COPD Father    Diabetes Son    Colon cancer Neg Hx    Esophageal cancer Neg Hx    Rectal cancer Neg Hx    Stomach cancer Neg Hx     No Known Allergies  Medication list has been reviewed and updated.  Current Outpatient Medications on File Prior to Visit  Medication Sig Dispense Refill   amLODipine (NORVASC) 5 MG tablet TAKE 1 TABLET (5 MG TOTAL) BY MOUTH DAILY. 90 tablet 1   fluticasone (FLONASE) 50 MCG/ACT nasal spray Place 2 sprays into both nostrils daily. 16 g 6   levocetirizine (XYZAL) 5 MG tablet Take 1 tablet by mouth daily.  4   levothyroxine (SYNTHROID) 75 MCG tablet Take 1 tablet (75 mcg total) by mouth daily before breakfast. 90 tablet 0   losartan (COZAAR) 50 MG tablet Take 1 tablet (  50 mg total) by mouth daily. 90 tablet 3   MITIGARE 0.6 MG CAPS TAKE 2 PILLS FOR GOUT ATTACK, THEN 1 PILL AN HOUR LATER 15 capsule 1   rosuvastatin (CRESTOR) 20 MG tablet Take 1 tablet (20 mg total) by mouth daily. 90 tablet 1   SYMBICORT 80-4.5 MCG/ACT inhaler INHALE 2 PUFFS INTO THE LUNGS TWICE A DAY 30.6 each 12   traZODone (DESYREL) 50 MG tablet TAKE 50 - 100 MG AT BEDTIME AS NEEDED FOR SLEEP 180 tablet 3   No current facility-administered medications on file prior to visit.    Review of Systems:  As per HPI- otherwise negative.   Physical Examination: Vitals:   12/08/23 1035  BP: 132/80  Pulse: 80  Resp: 18  Temp: 97.8 F (36.6 C)  SpO2: 97%   Vitals:   12/08/23 1035  Weight: 164 lb 9.6 oz (74.7 kg)  Height: 5\' 5"  (1.651 m)   Body mass index is  27.39 kg/m. Ideal Body Weight: Weight in (lb) to have BMI = 25: 149.9  GEN: no acute distress.  Minimal overweight, looks well  HEENT: Atraumatic, Normocephalic.   HOH Ears and Nose: No external deformity. CV: RRR, No M/G/R. No JVD. No thrill. No extra heart sounds. PULM: CTA B, no wheezes, crackles, rhonchi. No retractions. No resp. distress. No accessory muscle use. ABD: S, NT, ND, +BS. No rebound. No HSM. EXTR: No c/c/e PSYCH: Normally interactive. Conversant.    Assessment and Plan: Hypothyroidism due to acquired atrophy of thyroid - Plan: TSH  Dyslipidemia  Hypertension, unspecified type - Plan: Basic metabolic panel, CBC  Prediabetes - Plan: Hemoglobin A1c  Hypertensive chronic kidney disease with stage 1 through stage 4 chronic kidney disease, or unspecified chronic kidney disease - Plan: losartan (COZAAR) 100 MG tablet  Periodic recheck today.  Will be in touch with patient pending his labs Follow-up on thyroid Blood pressure under good control on current medication regimen Monitor prediabetes Signed Abbe Amsterdam, MD  Received labs 12/10- message to pt  Results for orders placed or performed in visit on 12/08/23  Basic metabolic panel  Result Value Ref Range   Sodium 140 135 - 145 mEq/L   Potassium 4.7 3.5 - 5.1 mEq/L   Chloride 107 96 - 112 mEq/L   CO2 20 19 - 32 mEq/L   Glucose, Bld 90 70 - 99 mg/dL   BUN 20 6 - 23 mg/dL   Creatinine, Ser 8.65 0.40 - 1.50 mg/dL   GFR 78.46 (L) >96.29 mL/min   Calcium 9.2 8.4 - 10.5 mg/dL  CBC  Result Value Ref Range   WBC 7.4 4.0 - 10.5 K/uL   RBC 4.90 4.22 - 5.81 Mil/uL   Platelets 205.0 150.0 - 400.0 K/uL   Hemoglobin 15.1 13.0 - 17.0 g/dL   HCT 52.8 41.3 - 24.4 %   MCV 93.9 78.0 - 100.0 fl   MCHC 32.9 30.0 - 36.0 g/dL   RDW 01.0 27.2 - 53.6 %  Hemoglobin A1c  Result Value Ref Range   Hgb A1c MFr Bld 6.1 4.6 - 6.5 %  TSH  Result Value Ref Range   TSH 3.45 0.35 - 5.50 uIU/mL

## 2023-12-04 NOTE — Patient Instructions (Addendum)
It was great to see you again today, I will be in touch with your blood work Assuming all is well please see me in about 6 months

## 2023-12-08 ENCOUNTER — Ambulatory Visit: Payer: Medicare PPO | Admitting: Family Medicine

## 2023-12-08 VITALS — BP 132/80 | HR 80 | Temp 97.8°F | Resp 18 | Ht 65.0 in | Wt 164.6 lb

## 2023-12-08 DIAGNOSIS — R7303 Prediabetes: Secondary | ICD-10-CM

## 2023-12-08 DIAGNOSIS — E034 Atrophy of thyroid (acquired): Secondary | ICD-10-CM

## 2023-12-08 DIAGNOSIS — I10 Essential (primary) hypertension: Secondary | ICD-10-CM | POA: Diagnosis not present

## 2023-12-08 DIAGNOSIS — I129 Hypertensive chronic kidney disease with stage 1 through stage 4 chronic kidney disease, or unspecified chronic kidney disease: Secondary | ICD-10-CM | POA: Diagnosis not present

## 2023-12-08 DIAGNOSIS — E785 Hyperlipidemia, unspecified: Secondary | ICD-10-CM

## 2023-12-08 LAB — BASIC METABOLIC PANEL
BUN: 20 mg/dL (ref 6–23)
CO2: 20 meq/L (ref 19–32)
Calcium: 9.2 mg/dL (ref 8.4–10.5)
Chloride: 107 meq/L (ref 96–112)
Creatinine, Ser: 1.45 mg/dL (ref 0.40–1.50)
GFR: 45.95 mL/min — ABNORMAL LOW (ref >60.00)
Glucose, Bld: 90 mg/dL (ref 70–99)
Potassium: 4.7 meq/L (ref 3.5–5.1)
Sodium: 140 meq/L (ref 135–145)

## 2023-12-08 LAB — TSH: TSH: 3.45 u[IU]/mL (ref 0.35–5.50)

## 2023-12-08 LAB — CBC
HCT: 46 % (ref 39.0–52.0)
Hemoglobin: 15.1 g/dL (ref 13.0–17.0)
MCHC: 32.9 g/dL (ref 30.0–36.0)
MCV: 93.9 fL (ref 78.0–100.0)
Platelets: 205 10*3/uL (ref 150.0–400.0)
RBC: 4.9 Mil/uL (ref 4.22–5.81)
RDW: 14 % (ref 11.5–15.5)
WBC: 7.4 10*3/uL (ref 4.0–10.5)

## 2023-12-08 LAB — HEMOGLOBIN A1C: Hgb A1c MFr Bld: 6.1 % (ref 4.6–6.5)

## 2023-12-08 MED ORDER — LOSARTAN POTASSIUM 100 MG PO TABS: 100.0000 mg | ORAL_TABLET | Freq: Every day | ORAL | 3 refills | Status: DC

## 2023-12-09 ENCOUNTER — Encounter: Payer: Self-pay | Admitting: Family Medicine

## 2023-12-09 DIAGNOSIS — L738 Other specified follicular disorders: Secondary | ICD-10-CM | POA: Diagnosis not present

## 2023-12-09 DIAGNOSIS — D485 Neoplasm of uncertain behavior of skin: Secondary | ICD-10-CM | POA: Diagnosis not present

## 2023-12-09 DIAGNOSIS — B078 Other viral warts: Secondary | ICD-10-CM | POA: Diagnosis not present

## 2023-12-09 DIAGNOSIS — L821 Other seborrheic keratosis: Secondary | ICD-10-CM | POA: Diagnosis not present

## 2023-12-09 DIAGNOSIS — L814 Other melanin hyperpigmentation: Secondary | ICD-10-CM | POA: Diagnosis not present

## 2023-12-09 DIAGNOSIS — D225 Melanocytic nevi of trunk: Secondary | ICD-10-CM | POA: Diagnosis not present

## 2023-12-09 DIAGNOSIS — Z85828 Personal history of other malignant neoplasm of skin: Secondary | ICD-10-CM | POA: Diagnosis not present

## 2023-12-09 DIAGNOSIS — L578 Other skin changes due to chronic exposure to nonionizing radiation: Secondary | ICD-10-CM | POA: Diagnosis not present

## 2023-12-09 DIAGNOSIS — L720 Epidermal cyst: Secondary | ICD-10-CM | POA: Diagnosis not present

## 2023-12-09 DIAGNOSIS — L570 Actinic keratosis: Secondary | ICD-10-CM | POA: Diagnosis not present

## 2023-12-20 ENCOUNTER — Other Ambulatory Visit: Payer: Self-pay | Admitting: Family Medicine

## 2023-12-20 DIAGNOSIS — F5101 Primary insomnia: Secondary | ICD-10-CM

## 2023-12-23 ENCOUNTER — Other Ambulatory Visit: Payer: Self-pay | Admitting: Family Medicine

## 2023-12-23 DIAGNOSIS — E782 Mixed hyperlipidemia: Secondary | ICD-10-CM

## 2024-02-09 DIAGNOSIS — H40053 Ocular hypertension, bilateral: Secondary | ICD-10-CM | POA: Diagnosis not present

## 2024-02-09 DIAGNOSIS — H26492 Other secondary cataract, left eye: Secondary | ICD-10-CM | POA: Diagnosis not present

## 2024-02-18 ENCOUNTER — Other Ambulatory Visit: Payer: Self-pay | Admitting: Family Medicine

## 2024-02-18 DIAGNOSIS — I10 Essential (primary) hypertension: Secondary | ICD-10-CM

## 2024-03-07 ENCOUNTER — Other Ambulatory Visit: Payer: Self-pay | Admitting: Family Medicine

## 2024-03-07 DIAGNOSIS — E038 Other specified hypothyroidism: Secondary | ICD-10-CM

## 2024-03-22 DIAGNOSIS — N1831 Chronic kidney disease, stage 3a: Secondary | ICD-10-CM | POA: Diagnosis not present

## 2024-03-23 DIAGNOSIS — N400 Enlarged prostate without lower urinary tract symptoms: Secondary | ICD-10-CM | POA: Diagnosis not present

## 2024-03-23 DIAGNOSIS — R972 Elevated prostate specific antigen [PSA]: Secondary | ICD-10-CM | POA: Diagnosis not present

## 2024-03-25 DIAGNOSIS — J4530 Mild persistent asthma, uncomplicated: Secondary | ICD-10-CM | POA: Diagnosis not present

## 2024-03-25 DIAGNOSIS — H1045 Other chronic allergic conjunctivitis: Secondary | ICD-10-CM | POA: Diagnosis not present

## 2024-03-25 DIAGNOSIS — J301 Allergic rhinitis due to pollen: Secondary | ICD-10-CM | POA: Diagnosis not present

## 2024-03-25 DIAGNOSIS — J3089 Other allergic rhinitis: Secondary | ICD-10-CM | POA: Diagnosis not present

## 2024-03-26 ENCOUNTER — Other Ambulatory Visit: Payer: Self-pay | Admitting: Urology

## 2024-03-26 DIAGNOSIS — R972 Elevated prostate specific antigen [PSA]: Secondary | ICD-10-CM

## 2024-04-01 DIAGNOSIS — I10 Essential (primary) hypertension: Secondary | ICD-10-CM | POA: Diagnosis not present

## 2024-04-01 DIAGNOSIS — N049 Nephrotic syndrome with unspecified morphologic changes: Secondary | ICD-10-CM | POA: Diagnosis not present

## 2024-05-05 ENCOUNTER — Encounter: Payer: Self-pay | Admitting: Urology

## 2024-05-07 ENCOUNTER — Ambulatory Visit
Admission: RE | Admit: 2024-05-07 | Discharge: 2024-05-07 | Disposition: A | Discharge status: Home / Self Care | Source: Ambulatory Visit | Attending: Urology | Admitting: Urology

## 2024-05-07 DIAGNOSIS — N400 Enlarged prostate without lower urinary tract symptoms: Secondary | ICD-10-CM | POA: Diagnosis not present

## 2024-05-07 DIAGNOSIS — R972 Elevated prostate specific antigen [PSA]: Secondary | ICD-10-CM

## 2024-05-07 DIAGNOSIS — K4020 Bilateral inguinal hernia, without obstruction or gangrene, not specified as recurrent: Secondary | ICD-10-CM | POA: Diagnosis not present

## 2024-05-07 MED ORDER — GADOPICLENOL 0.5 MMOL/ML IV SOLN
7.0000 mL | Freq: Once | INTRAVENOUS | Status: AC | PRN
  Administered 2024-05-07: 7 mL via INTRAVENOUS

## 2024-06-02 ENCOUNTER — Other Ambulatory Visit: Payer: Self-pay | Admitting: Family Medicine

## 2024-06-02 DIAGNOSIS — F5101 Primary insomnia: Secondary | ICD-10-CM

## 2024-06-03 ENCOUNTER — Other Ambulatory Visit: Payer: Self-pay | Admitting: Family Medicine

## 2024-06-03 DIAGNOSIS — E038 Other specified hypothyroidism: Secondary | ICD-10-CM

## 2024-06-03 NOTE — Progress Notes (Signed)
 [facility] [address], [address] [place], [place] [address] [phone]  Date:  06/07/2024   Name:  Shawn Santos   DOB:  1946-12-30   MRN:  [id number]  PCP:  Kaylee Partridge, MD    Chief Complaint: No chief complaint on file.   History of Present Illness:  Shawn Santos is a 78 y.o. very pleasant male patient who presents with the following:  Patient seen today for periodic follow-up-most recent visit with myself was in December  History of hypertension, celiac sprue, hypothyroidism, hyperlipidemia, anemia, proteinuria, hearing loss, prediabetes He sees urology for PSA-he saw Dr. Dulcy Gibney in March.  We can request this record  He saw nephrology most recently in April-minimal-change disease.  His nephrologist is Dr. Jearldine Mina.  Continue losartan  and monitor blood pressure.  Nephrology gave the okay for as needed follow-up.  They would like us  to continue periodic kidney labs and annual urine microalbumin-if urine protein levels go up they are glad to see the patient back  Retired from his work as a Comptroller at [employer] Enjoys walking and family.  He walks 5 miles a day  Reminded tetanus is due next year He has completed Shingrix  and RSV, pneumonia is complete Home BP 130- 140/70- 80 Walking 5 miles daily with no CP or SOB  Amlodipine  5 Levothyroxine  75 Losartan  100 Colchicine  as needed Crestor  20 Symbicort Trazodone  at bedtime  He has been seeing an allergist-recently treated with short term prednisone  and nasal spray He is now seeing Dr Derinda Fleischer from a urology standpoint -did an MRI of his prostate which was reassuring  Narrative & Impression   IMPRESSION: 1. No focal lesion of intermediate or higher suspicion for prostate cancer is identified. 2. Prostatomegaly and benign prostatic hypertrophy. 3. Bilateral groin hernias favoring direct inguinal hernias contain adipose tissue. 4. Fatty filum terminale. 5.  Chronic atrophy of central portions of the gluteus maximus muscles bilaterally.   BP Readings from Last 3 Encounters:  06/07/24 (!) 161/82  12/08/23 132/80  06/04/23 138/60   Pulse Readings from Last 3 Encounters:  06/07/24 (!) 102  12/08/23 80  06/04/23 78    Patient Active Problem List   Diagnosis Date Noted   Prediabetes 11/29/2022   Skin cancer 10/28/2022   Mixed hyperlipidemia 12/05/2014   Bloating 09/16/2014   Proteinuria 05/30/2014   Eosinophilia 02/01/2013   Elevated PSA 02/01/2013   HTN (hypertension) 07/27/2012   Insomnia 07/27/2012   Hypothyroid 07/27/2012   ANEMIA, IRON DEFICIENCY 04/18/2008   CELIAC SPRUE 04/18/2008   Duodenitis 06/02/2007    Past Medical History:  Diagnosis Date   Allergy    Asthma    Celiac disease    Chronic kidney disease    nephrotic syndrome   Hyperlipidemia    Hypertension    Insomnia    PSA elevation    Thyroid  disease     Past Surgical History:  Procedure Laterality Date   COLONOSCOPY     EYE SURGERY     cataract- both eyes   UPPER GASTROINTESTINAL ENDOSCOPY      Social History   Tobacco Use   Smoking status: Never   Smokeless tobacco: Never  Substance Use Topics   Alcohol use: No    Comment: RARELY BEER   Drug use: No    Family History  Problem Relation Age of Onset   Alcohol abuse Mother    COPD Father    Diabetes Son  Colon cancer Neg Hx    Esophageal cancer Neg Hx    Rectal cancer Neg Hx    Stomach cancer Neg Hx     No Known Allergies  Medication list has been reviewed and updated.  Current Outpatient Medications on File Prior to Visit  Medication Sig Dispense Refill   amLODipine  (NORVASC ) 5 MG tablet TAKE 1 TABLET (5 MG TOTAL) BY MOUTH DAILY. 90 tablet 1   fluticasone  (FLONASE ) 50 MCG/ACT nasal spray Place 2 sprays into both nostrils daily. 16 g 6   levocetirizine (XYZAL) 5 MG tablet Take 1 tablet by mouth daily.  4   levothyroxine  (SYNTHROID ) 75 MCG tablet Take 1 tablet (75 mcg total) by  mouth daily before breakfast. 90 tablet 0   losartan  (COZAAR ) 100 MG tablet Take 1 tablet (100 mg total) by mouth daily. 90 tablet 3   MITIGARE  0.6 MG CAPS TAKE 2 PILLS FOR GOUT ATTACK, THEN 1 PILL AN HOUR LATER 15 capsule 1   rosuvastatin  (CRESTOR ) 20 MG tablet Take 1 tablet (20 mg total) by mouth daily. 90 tablet 1   SYMBICORT 80-4.5 MCG/ACT inhaler INHALE 2 PUFFS INTO THE LUNGS TWICE A DAY 30.6 each 12   traZODone  (DESYREL ) 50 MG tablet Take 1-2 tablets (50-100 mg total) by mouth at bedtime as needed for sleep. 180 tablet 0   No current facility-administered medications on file prior to visit.    Review of Systems:  As per HPI- otherwise negative.   Physical Examination: Vitals:   06/07/24 1023 06/07/24 1044  BP: (!) 165/93 (!) 161/82  Pulse: (!) 114 (!) 102   Vitals:   06/07/24 1023  Weight: 163 lb 12.8 oz (74.3 kg)  Height: 5\' 5"  (1.651 m)   Body mass index is 27.26 kg/m. Ideal Body Weight: Weight in (lb) to have BMI = 25: 149.9  GEN: no acute distress.  Normal weight, looks well HEENT: Atraumatic, Normocephalic.  Ears and Nose: No external deformity. CV: RRR, No M/G/R. No JVD. No thrill. No extra heart sounds. PULM: CTA B, no wheezes, crackles, rhonchi. No retractions. No resp. distress. No accessory muscle use. ABD: S, NT, ND, +BS. No rebound. No HSM. EXTR: No c/c/e PSYCH: Normally interactive. Conversant.   Noted tachycardia and elevated blood pressure today.  Patient states his blood pressure can be high at MD office but looks good at home.  He has no symptoms of tachycardia, denies any shortness of breath or chest pain.  He is willing to get an EKG today  EKG: Sinus tachycardia, rate 106.  Low voltage in limb leads with possible old infarct.  When compared with EKG from 2015 no concerning changes are noted Assessment and Plan: Hypothyroidism due to acquired atrophy of thyroid  - Plan: TSH  Dyslipidemia - Plan: Lipid panel  Hypertension, unspecified type - Plan:  CBC, Comprehensive metabolic panel with GFR  Prediabetes - Plan: Hemoglobin A1c  Tachycardia - Plan: EKG 12-Lead  Proteinuria, unspecified type  Patient seen today for follow-up.  Note tachycardia, EKG overall reassuring.  He denies chest pain or shortness of breath.  He agrees to monitor his vital signs at home and keep me posted.  Will check TSH today He has been followed by nephrology for about a decade and is now released to our routine follow-up care Blood pressure is typically okay at home-treated with amlodipine  and losartan   Patient did check his blood pressure and pulse when he got home-  Doctor Copeland-took my bp the minute i got home. 104/70  pulse 108.  He will let me know if tachycardia is persistent or if any symptoms develop   Signed Gates Kasal, MD

## 2024-06-03 NOTE — Patient Instructions (Addendum)
 It was great to see you again today, I will be in touch with your lab work You are due for a tetanus booster next year; you can go ahead and have this done at the pharmacy at your convenience.  If you have any significant or dirty wound please to get a booster!   Assuming all is well please see me in about 6 months for follow-up!   Please check your BP and pu

## 2024-06-07 ENCOUNTER — Encounter: Payer: Self-pay | Admitting: Family Medicine

## 2024-06-07 ENCOUNTER — Ambulatory Visit: Payer: Medicare PPO | Admitting: Family Medicine

## 2024-06-07 VITALS — BP 161/82 | HR 102 | Ht 65.0 in | Wt 163.8 lb

## 2024-06-07 DIAGNOSIS — R000 Tachycardia, unspecified: Secondary | ICD-10-CM

## 2024-06-07 DIAGNOSIS — R809 Proteinuria, unspecified: Secondary | ICD-10-CM

## 2024-06-07 DIAGNOSIS — E785 Hyperlipidemia, unspecified: Secondary | ICD-10-CM | POA: Diagnosis not present

## 2024-06-07 DIAGNOSIS — I10 Essential (primary) hypertension: Secondary | ICD-10-CM

## 2024-06-07 DIAGNOSIS — R7303 Prediabetes: Secondary | ICD-10-CM | POA: Diagnosis not present

## 2024-06-07 DIAGNOSIS — E034 Atrophy of thyroid (acquired): Secondary | ICD-10-CM | POA: Diagnosis not present

## 2024-06-07 LAB — COMPREHENSIVE METABOLIC PANEL WITH GFR
ALT: 13 U/L (ref 0–53)
AST: 20 U/L (ref 0–37)
Albumin: 4.7 g/dL (ref 3.5–5.2)
Alkaline Phosphatase: 69 U/L (ref 39–117)
BUN: 18 mg/dL (ref 6–23)
CO2: 21 meq/L (ref 19–32)
Calcium: 9.3 mg/dL (ref 8.4–10.5)
Chloride: 106 meq/L (ref 96–112)
Creatinine, Ser: 1.07 mg/dL (ref 0.40–1.50)
GFR: 65.94 mL/min (ref >60.00)
Glucose, Bld: 128 mg/dL — ABNORMAL HIGH (ref 70–99)
Potassium: 4.1 meq/L (ref 3.5–5.1)
Sodium: 139 meq/L (ref 135–145)
Total Bilirubin: 0.6 mg/dL (ref 0.2–1.2)
Total Protein: 7.2 g/dL (ref 6.0–8.3)

## 2024-06-07 LAB — LIPID PANEL
Cholesterol: 136 mg/dL (ref 0–200)
HDL: 45.4 mg/dL (ref >39.00)
LDL Cholesterol: 65 mg/dL (ref 0–99)
NonHDL: 90.76
Total CHOL/HDL Ratio: 3
Triglycerides: 131 mg/dL (ref 0.0–149.0)
VLDL: 26.2 mg/dL (ref 0.0–40.0)

## 2024-06-07 LAB — HEMOGLOBIN A1C: Hgb A1c MFr Bld: 6 % (ref 4.6–6.5)

## 2024-06-07 LAB — CBC
HCT: 47.9 % (ref 39.0–52.0)
Hemoglobin: 15.9 g/dL (ref 13.0–17.0)
MCHC: 33.1 g/dL (ref 30.0–36.0)
MCV: 93 fl (ref 78.0–100.0)
Platelets: 204 10*3/uL (ref 150.0–400.0)
RBC: 5.15 Mil/uL (ref 4.22–5.81)
RDW: 14 % (ref 11.5–15.5)
WBC: 8.9 10*3/uL (ref 4.0–10.5)

## 2024-06-10 ENCOUNTER — Encounter: Payer: Self-pay | Admitting: Family Medicine

## 2024-06-10 LAB — TSH: TSH: 2.12 u[IU]/mL (ref 0.35–5.50)

## 2024-06-12 ENCOUNTER — Ambulatory Visit: Attending: Family Medicine

## 2024-06-12 DIAGNOSIS — R000 Tachycardia, unspecified: Secondary | ICD-10-CM

## 2024-06-12 NOTE — Addendum Note (Signed)
 Addended by: Gates Kasal C on: 06/12/2024 12:36 PM   Modules accepted: Orders

## 2024-06-14 NOTE — Progress Notes (Unsigned)
 EP to read.

## 2024-06-15 ENCOUNTER — Other Ambulatory Visit: Payer: Self-pay | Admitting: Family Medicine

## 2024-06-15 DIAGNOSIS — E782 Mixed hyperlipidemia: Secondary | ICD-10-CM

## 2024-06-21 ENCOUNTER — Encounter: Payer: Self-pay | Admitting: Family Medicine

## 2024-06-29 ENCOUNTER — Encounter: Payer: Self-pay | Admitting: Family Medicine

## 2024-06-29 ENCOUNTER — Ambulatory Visit: Attending: Cardiology

## 2024-07-08 ENCOUNTER — Encounter: Payer: Self-pay | Admitting: Family Medicine

## 2024-07-09 DIAGNOSIS — R000 Tachycardia, unspecified: Secondary | ICD-10-CM | POA: Diagnosis not present

## 2024-07-13 ENCOUNTER — Encounter: Payer: Self-pay | Admitting: Family Medicine

## 2024-07-13 DIAGNOSIS — R000 Tachycardia, unspecified: Secondary | ICD-10-CM | POA: Diagnosis not present

## 2024-07-14 ENCOUNTER — Encounter: Payer: Self-pay | Admitting: Family Medicine

## 2024-07-14 DIAGNOSIS — I480 Paroxysmal atrial fibrillation: Secondary | ICD-10-CM

## 2024-07-15 MED ORDER — APIXABAN 5 MG PO TABS: 5.0000 mg | ORAL_TABLET | Freq: Two times a day (BID) | ORAL | 1 refills | Status: DC

## 2024-07-16 ENCOUNTER — Ambulatory Visit (HOSPITAL_COMMUNITY)
Admission: RE | Admit: 2024-07-16 | Discharge: 2024-07-16 | Disposition: A | Discharge status: Home / Self Care | Source: Ambulatory Visit | Attending: Internal Medicine | Admitting: Internal Medicine

## 2024-07-16 ENCOUNTER — Encounter (HOSPITAL_COMMUNITY): Payer: Self-pay | Admitting: Internal Medicine

## 2024-07-16 VITALS — BP 150/90 | HR 92 | Ht 65.0 in | Wt 162.8 lb

## 2024-07-16 DIAGNOSIS — D6869 Other thrombophilia: Secondary | ICD-10-CM | POA: Diagnosis not present

## 2024-07-16 DIAGNOSIS — I480 Paroxysmal atrial fibrillation: Secondary | ICD-10-CM | POA: Diagnosis not present

## 2024-07-16 DIAGNOSIS — I4891 Unspecified atrial fibrillation: Secondary | ICD-10-CM | POA: Diagnosis not present

## 2024-07-16 MED ORDER — APIXABAN 5 MG PO TABS: 5.0000 mg | ORAL_TABLET | Freq: Two times a day (BID) | ORAL | 1 refills | Status: AC

## 2024-07-16 NOTE — Patient Instructions (Signed)
 Lab work in one month at any [facility]  08/16/24

## 2024-07-16 NOTE — Progress Notes (Signed)
 Primary Care Physician: Watt Harlene BROCKS, MD Primary Cardiologist: None Electrophysiologist: None     Referring Physician: Watt Harlene BROCKS, MD     Shawn Santos is a 78 y.o. male with a history of HTN, celiac sprue, hypothyroidism, HLD, anemia, prediabetes, and atrial fibrillation who presents for consultation in the [facility]. Cardiac monitor placed by PCP showed 10% new Afib burden with average rate 94 BPM. Patient is on Eliquis  5 mg BID for a CHADS2VASC score of 3.  On evaluation today, he is currently in NSR. He did not have cardiac awareness of arrhythmia. Patient began Eliquis  yesterday. He notes to sleep well and does not wake up gasping for breath; wakes up feeling good. He notes to have ~1.5 cups of coffee daily and an occasional alcoholic seltzer ~6-5 times per month.    Today, he denies symptoms of palpitations, chest pain, shortness of breath, orthopnea, PND, lower extremity edema, dizziness, presyncope, syncope, snoring, daytime somnolence, bleeding, or neurologic sequela. The patient is tolerating medications without difficulties and is otherwise without complaint today.    Atrial Fibrillation Risk Factors:  he does not have symptoms or diagnosis of sleep apnea.   he has a BMI of Body mass index is 27.09 kg/m.SABRA Filed Weights   07/16/24 1429  Weight: 73.8 kg    Current Outpatient Medications  Medication Sig Dispense Refill   amLODipine  (NORVASC ) 5 MG tablet TAKE 1 TABLET (5 MG TOTAL) BY MOUTH DAILY. 90 tablet 1   fluticasone  (FLONASE ) 50 MCG/ACT nasal spray Place 2 sprays into both nostrils daily. 16 g 6   levocetirizine (XYZAL) 5 MG tablet Take 1 tablet by mouth daily.  4   levothyroxine  (SYNTHROID ) 75 MCG tablet Take 1 tablet (75 mcg total) by mouth daily before breakfast. 90 tablet 0   losartan  (COZAAR ) 100 MG tablet Take 1 tablet (100 mg total) by mouth daily. 90 tablet 3   MITIGARE  0.6 MG CAPS TAKE 2 PILLS FOR GOUT ATTACK,  THEN 1 PILL AN HOUR LATER 15 capsule 1   rosuvastatin  (CRESTOR ) 20 MG tablet Take 1 tablet (20 mg total) by mouth daily. 90 tablet 1   SYMBICORT 80-4.5 MCG/ACT inhaler INHALE 2 PUFFS INTO THE LUNGS TWICE A DAY 30.6 each 12   traZODone  (DESYREL ) 50 MG tablet Take 1-2 tablets (50-100 mg total) by mouth at bedtime as needed for sleep. 180 tablet 0   apixaban  (ELIQUIS ) 5 MG TABS tablet Take 1 tablet (5 mg total) by mouth 2 (two) times daily. 180 tablet 1   No current facility-administered medications for this encounter.    Atrial Fibrillation Management history:  Previous antiarrhythmic drugs: none Previous cardioversions: none Previous ablations: none Anticoagulation history: none   ROS- All systems are reviewed and negative except as per the HPI above.  Physical Exam: BP (!) 150/90   Pulse 92   Ht 5' 5 (1.651 m)   Wt 73.8 kg   BMI 27.09 kg/m   GEN: Well nourished, well developed in no acute distress NECK: No JVD; No carotid bruits CARDIAC: Regular rate and rhythm, no murmurs, rubs, gallops RESPIRATORY:  Clear to auscultation without rales, wheezing or rhonchi  ABDOMEN: Soft, non-tender, non-distended EXTREMITIES:  No edema; No deformity   EKG today demonstrates  Vent. rate 92 BPM PR interval 178 ms QRS duration 78 ms QT/QTcB 356/440 ms P-R-T axes 44 -22 36 Sinus rhythm with Premature atrial complexes Anterior infarct , age undetermined Abnormal ECG No previous ECGs available  Echo N/A  ASSESSMENT & PLAN CHA2DS2-VASc Score = 3  The patient's score is based upon: CHF History: 0 HTN History: 1 Diabetes History: 0 Stroke History: 0 Vascular Disease History: 0 Age Score: 2 Gender Score: 0       ASSESSMENT AND PLAN: Paroxysmal Atrial Fibrillation (ICD10:  I48.0) The patient's CHA2DS2-VASc score is 3, indicating a 3.2% annual risk of stroke.    He is currently in NSR. Education provided about Afib. Discussion about triggers for Afib. Declines sleep study. Will  order baseline echocardiogram. We discussed cardiac monitor results showing 10% Afib burden overall rate controlled. Due to patient not having cardiac awareness, will continue with serial conservative observation. If echocardiogram shows abnormal function, will revisit aggressive rhythm control.   Secondary Hypercoagulable State (ICD10:  D68.69) The patient is at significant risk for stroke/thromboembolism based upon his CHA2DS2-VASc Score of 3.  Continue Apixaban  (Eliquis ).  We discussed the reasoning behind anticoagulation in the setting of stroke prevention related to Afib. We discussed the benefits vs risks of anticoagulation. After discussion, patient would like to continue anticoagulation and understands the potential risks. Will print order for patient to have CBC in 1 month.    Follow up 6  months Afib clinic.    Terra Pac, PA-C  Af[facility] [address] [place], [place] [address] [phone]

## 2024-07-22 ENCOUNTER — Other Ambulatory Visit (HOSPITAL_COMMUNITY): Payer: Self-pay

## 2024-07-22 DIAGNOSIS — I480 Paroxysmal atrial fibrillation: Secondary | ICD-10-CM

## 2024-07-30 DIAGNOSIS — I4891 Unspecified atrial fibrillation: Secondary | ICD-10-CM | POA: Diagnosis not present

## 2024-07-31 LAB — CBC
Hematocrit: 46.5 % (ref 37.5–51.0)
Hemoglobin: 15.5 g/dL (ref 13.0–17.7)
MCH: 31.4 pg (ref 26.6–33.0)
MCHC: 33.3 g/dL (ref 31.5–35.7)
MCV: 94 fL (ref 79–97)
Platelets: 236 x10E3/uL (ref 150–450)
RBC: 4.94 x10E6/uL (ref 4.14–5.80)
RDW: 12.6 % (ref 11.6–15.4)
WBC: 8.2 x10E3/uL (ref 3.4–10.8)

## 2024-08-02 ENCOUNTER — Ambulatory Visit (HOSPITAL_COMMUNITY): Payer: Self-pay | Admitting: Internal Medicine

## 2024-08-04 ENCOUNTER — Other Ambulatory Visit: Payer: Self-pay | Admitting: Family Medicine

## 2024-08-04 DIAGNOSIS — I10 Essential (primary) hypertension: Secondary | ICD-10-CM

## 2024-08-20 ENCOUNTER — Ambulatory Visit (HOSPITAL_COMMUNITY): Admitting: Internal Medicine

## 2024-08-28 ENCOUNTER — Other Ambulatory Visit: Payer: Self-pay | Admitting: Family Medicine

## 2024-08-28 DIAGNOSIS — E038 Other specified hypothyroidism: Secondary | ICD-10-CM

## 2024-08-28 DIAGNOSIS — F5101 Primary insomnia: Secondary | ICD-10-CM

## 2024-09-01 ENCOUNTER — Ambulatory Visit (HOSPITAL_COMMUNITY)
Admission: RE | Admit: 2024-09-01 | Discharge: 2024-09-01 | Disposition: A | Discharge status: Home / Self Care | Source: Ambulatory Visit | Attending: Internal Medicine | Admitting: Internal Medicine

## 2024-09-01 DIAGNOSIS — I480 Paroxysmal atrial fibrillation: Secondary | ICD-10-CM | POA: Diagnosis not present

## 2024-09-01 LAB — ECHOCARDIOGRAM COMPLETE
Area-P 1/2: 2.56 cm2
S' Lateral: 2.1 cm

## 2024-09-02 ENCOUNTER — Encounter: Payer: Self-pay | Admitting: Family Medicine

## 2024-09-02 ENCOUNTER — Ambulatory Visit (HOSPITAL_COMMUNITY): Payer: Self-pay | Admitting: Internal Medicine

## 2024-11-27 ENCOUNTER — Other Ambulatory Visit: Payer: Self-pay | Admitting: Family Medicine

## 2024-11-27 DIAGNOSIS — I129 Hypertensive chronic kidney disease with stage 1 through stage 4 chronic kidney disease, or unspecified chronic kidney disease: Secondary | ICD-10-CM

## 2024-11-27 DIAGNOSIS — E782 Mixed hyperlipidemia: Secondary | ICD-10-CM

## 2024-11-28 ENCOUNTER — Other Ambulatory Visit: Payer: Self-pay | Admitting: Family Medicine

## 2024-11-28 DIAGNOSIS — J4530 Mild persistent asthma, uncomplicated: Secondary | ICD-10-CM

## 2024-12-01 ENCOUNTER — Other Ambulatory Visit: Payer: Self-pay | Admitting: Family Medicine

## 2024-12-01 DIAGNOSIS — F5101 Primary insomnia: Secondary | ICD-10-CM

## 2024-12-01 DIAGNOSIS — E038 Other specified hypothyroidism: Secondary | ICD-10-CM

## 2024-12-02 DIAGNOSIS — J3089 Other allergic rhinitis: Secondary | ICD-10-CM | POA: Diagnosis not present

## 2024-12-02 DIAGNOSIS — H1045 Other chronic allergic conjunctivitis: Secondary | ICD-10-CM | POA: Diagnosis not present

## 2024-12-02 DIAGNOSIS — J301 Allergic rhinitis due to pollen: Secondary | ICD-10-CM | POA: Diagnosis not present

## 2024-12-02 DIAGNOSIS — J4530 Mild persistent asthma, uncomplicated: Secondary | ICD-10-CM | POA: Diagnosis not present

## 2024-12-05 NOTE — Patient Instructions (Signed)
 I will be in touch with your labs, have a wonderful holiday season

## 2024-12-05 NOTE — Progress Notes (Unsigned)
 [facility] [address], [address] [place], [place] [address] [phone]  Date:  12/06/2024   Name:  Shawn Santos   DOB:  04/18/1946   MRN:  [id number]  PCP:  Watt Harlene BROCKS, MD    Chief Complaint: No chief complaint on file.   History of Present Illness:  Shawn Santos is a 78 y.o. very pleasant male patient who presents with the following:  Patient seen today for periodic follow-up.  I saw him most recently in June History of hypertension, celiac sprue, hypothyroidism, hyperlipidemia, anemia, proteinuria, hearing loss, prediabetes Retired from his work as a comptroller at [employer] Enjoys walking and family.  He walks 5 miles a day  He sees urology for PSA- Dr Cam He also follows up with nephrology, Dr. Marlee; right now they have released him to my care, they do request periodic lab work and annual urine microalbumin  He was diagnosed with paroxysmal atrial fibrillation earlier this year Seen in atrial fibrillation clinic over the summer.  He had 10% A-fib burden on his Zio patch.  He has now taking Eliquis  Echo in September was reassuring He has completed seasonal flu shot, RSV, pneumonia vaccination, Shingrix  and several doses of COVID-19 booster  Amlodipine  5 Eliquis  5 twice daily Levothyroxine  Losartan  Crestor  Symbicort Trazodone  at bedtime as needed Lab work done in June except he did have a CBC in August  Discussed the use of AI scribe software for clinical note transcription with the patient, who gave verbal consent to proceed.  History of Present Illness     Patient Active Problem List   Diagnosis Date Noted   Prediabetes 11/29/2022   Skin cancer 10/28/2022   Mixed hyperlipidemia 12/05/2014   Bloating 09/16/2014   Proteinuria 05/30/2014   Eosinophilia 02/01/2013   Elevated PSA 02/01/2013   HTN (hypertension) 07/27/2012   Insomnia 07/27/2012   Hypothyroid 07/27/2012   ANEMIA, IRON  DEFICIENCY 04/18/2008   CELIAC SPRUE 04/18/2008   Duodenitis 06/02/2007    Past Medical History:  Diagnosis Date   Allergy    Asthma    Celiac disease    Chronic kidney disease    nephrotic syndrome   Hyperlipidemia    Hypertension    Insomnia    PSA elevation    Thyroid  disease     Past Surgical History:  Procedure Laterality Date   COLONOSCOPY     EYE SURGERY     cataract- both eyes   UPPER GASTROINTESTINAL ENDOSCOPY      Social History   Tobacco Use   Smoking status: Never   Smokeless tobacco: Never   Tobacco comments:    Never smoked 07/16/24  Substance Use Topics   Alcohol use: No    Comment: RARELY BEER   Drug use: No    Family History  Problem Relation Age of Onset   Alcohol abuse Mother    COPD Father    Diabetes Son    Colon cancer Neg Hx    Esophageal cancer Neg Hx    Rectal cancer Neg Hx    Stomach cancer Neg Hx     No Known Allergies  Medication list has been reviewed and updated.  Current Outpatient Medications on File Prior to Visit  Medication Sig Dispense Refill   amLODipine  (NORVASC ) 5 MG tablet TAKE 1 TABLET (5 MG TOTAL) BY MOUTH DAILY. 90 tablet 1   apixaban  (ELIQUIS ) 5 MG TABS tablet Take 1 tablet (5 mg  total) by mouth 2 (two) times daily. 180 tablet 1   fluticasone  (FLONASE ) 50 MCG/ACT nasal spray Place 2 sprays into both nostrils daily. 16 g 6   levocetirizine (XYZAL) 5 MG tablet Take 1 tablet by mouth daily.  4   levothyroxine  (SYNTHROID ) 75 MCG tablet TAKE 1 TABLET BY MOUTH DAILY BEFORE BREAKFAST. 90 tablet 0   losartan  (COZAAR ) 100 MG tablet TAKE 1 TABLET BY MOUTH EVERY DAY 90 tablet 3   MITIGARE  0.6 MG CAPS TAKE 2 PILLS FOR GOUT ATTACK, THEN 1 PILL AN HOUR LATER 15 capsule 1   rosuvastatin  (CRESTOR ) 20 MG tablet TAKE 1 TABLET BY MOUTH EVERY DAY 90 tablet 1   SYMBICORT 80-4.5 MCG/ACT inhaler INHALE 2 PUFFS INTO THE LUNGS TWICE A DAY 30.6 each 12   traZODone  (DESYREL ) 50 MG tablet TAKE 1-2 TABLETS BY MOUTH AT BEDTIME AS NEEDED  FOR SLEEP. 180 tablet 0   No current facility-administered medications on file prior to visit.    Review of Systems:  As per HPI- otherwise negative.   Physical Examination: There were no vitals filed for this visit. There were no vitals filed for this visit. There is no height or weight on file to calculate BMI. Ideal Body Weight:    GEN: no acute distress. HEENT: Atraumatic, Normocephalic.  Ears and Nose: No external deformity. CV: RRR, No M/G/R. No JVD. No thrill. No extra heart sounds. PULM: CTA B, no wheezes, crackles, rhonchi. No retractions. No resp. distress. No accessory muscle use. ABD: S, NT, ND, +BS. No rebound. No HSM. EXTR: No c/c/e PSYCH: Normally interactive. Conversant.    Assessment and Plan: No diagnosis found.  Assessment & Plan   Signed Harlene Schroeder, MD

## 2024-12-06 ENCOUNTER — Encounter: Payer: Self-pay | Admitting: Family Medicine

## 2024-12-06 ENCOUNTER — Ambulatory Visit: Admitting: Family Medicine

## 2024-12-06 VITALS — BP 128/80 | HR 84 | Ht 65.0 in | Wt 168.2 lb

## 2024-12-06 DIAGNOSIS — I480 Paroxysmal atrial fibrillation: Secondary | ICD-10-CM | POA: Diagnosis not present

## 2024-12-06 DIAGNOSIS — E782 Mixed hyperlipidemia: Secondary | ICD-10-CM | POA: Diagnosis not present

## 2024-12-06 DIAGNOSIS — E039 Hypothyroidism, unspecified: Secondary | ICD-10-CM

## 2024-12-06 DIAGNOSIS — I10 Essential (primary) hypertension: Secondary | ICD-10-CM | POA: Diagnosis not present

## 2024-12-06 DIAGNOSIS — R7303 Prediabetes: Secondary | ICD-10-CM | POA: Diagnosis not present

## 2024-12-06 LAB — CBC
HCT: 48.3 % (ref 39.0–52.0)
Hemoglobin: 16 g/dL (ref 13.0–17.0)
MCHC: 33.2 g/dL (ref 30.0–36.0)
MCV: 92.8 fl (ref 78.0–100.0)
Platelets: 214 K/uL (ref 150.0–400.0)
RBC: 5.2 Mil/uL (ref 4.22–5.81)
RDW: 13.5 % (ref 11.5–15.5)
WBC: 7.7 K/uL (ref 4.0–10.5)

## 2024-12-06 LAB — BASIC METABOLIC PANEL WITH GFR
BUN: 20 mg/dL (ref 6–23)
CO2: 22 meq/L (ref 19–32)
Calcium: 9.5 mg/dL (ref 8.4–10.5)
Chloride: 106 meq/L (ref 96–112)
Creatinine, Ser: 1.31 mg/dL (ref 0.40–1.50)
GFR: 51.54 mL/min — ABNORMAL LOW (ref >60.00)
Glucose, Bld: 109 mg/dL — ABNORMAL HIGH (ref 70–99)
Potassium: 4.6 meq/L (ref 3.5–5.1)
Sodium: 140 meq/L (ref 135–145)

## 2024-12-06 LAB — TSH: TSH: 3.23 u[IU]/mL (ref 0.35–5.50)

## 2024-12-06 LAB — HEMOGLOBIN A1C: Hgb A1c MFr Bld: 5.9 % (ref 4.6–6.5)

## 2025-01-20 ENCOUNTER — Other Ambulatory Visit: Payer: Self-pay | Admitting: Family Medicine

## 2025-01-20 DIAGNOSIS — I10 Essential (primary) hypertension: Secondary | ICD-10-CM

## 2025-01-21 ENCOUNTER — Encounter (HOSPITAL_COMMUNITY): Payer: Self-pay | Admitting: Internal Medicine

## 2025-01-21 ENCOUNTER — Ambulatory Visit (HOSPITAL_COMMUNITY)
Admission: RE | Admit: 2025-01-21 | Discharge: 2025-01-21 | Disposition: A | Source: Ambulatory Visit | Attending: Internal Medicine | Admitting: Internal Medicine

## 2025-01-21 VITALS — BP 148/92 | HR 85 | Ht 65.0 in | Wt 167.8 lb

## 2025-01-21 DIAGNOSIS — D6869 Other thrombophilia: Secondary | ICD-10-CM

## 2025-01-21 DIAGNOSIS — I480 Paroxysmal atrial fibrillation: Secondary | ICD-10-CM | POA: Diagnosis not present

## 2025-01-21 NOTE — Progress Notes (Signed)
 "   Primary Care Physician: Watt Harlene BROCKS, MD Primary Cardiologist: None Electrophysiologist: None     Referring Physician: Watt Harlene BROCKS, MD     Shawn Santos is a 79 y.o. male with a history of HTN, celiac sprue, hypothyroidism, HLD, anemia, prediabetes, and atrial fibrillation who presents for consultation in the [facility]. Cardiac monitor placed by PCP showed 10% new Afib burden with average rate 94 BPM. Patient is on Eliquis  5 mg BID for a CHADS2VASC score of 3.  On evaluation today, he is currently in NSR. He did not have cardiac awareness of arrhythmia. Patient began Eliquis  yesterday. He notes to sleep well and does not wake up gasping for breath; wakes up feeling good. He notes to have ~1.5 cups of coffee daily and an occasional alcoholic seltzer ~6-5 times per month.    Follow-up 01/21/2025.  Patient is currently in NSR.  He has noted overall very low A-fib burden since last office visit.  No bleeding issues on Eliquis .  Today, he denies symptoms of palpitations, chest pain, shortness of breath, orthopnea, PND, lower extremity edema, dizziness, presyncope, syncope, snoring, daytime somnolence, bleeding, or neurologic sequela. The patient is tolerating medications without difficulties and is otherwise without complaint today.    Atrial Fibrillation Risk Factors:  he does not have symptoms or diagnosis of sleep apnea.   he has a BMI of Body mass index is 27.92 kg/m.SABRA Filed Weights   01/21/25 1111  Weight: 76.1 kg     Current Outpatient Medications  Medication Sig Dispense Refill   amLODipine  (NORVASC ) 5 MG tablet Take 1 tablet (5 mg total) by mouth daily. 90 tablet 1   apixaban  (ELIQUIS ) 5 MG TABS tablet Take 1 tablet (5 mg total) by mouth 2 (two) times daily. 180 tablet 1   fluticasone  (FLONASE ) 50 MCG/ACT nasal spray Place 2 sprays into both nostrils daily. 16 g 6   levocetirizine (XYZAL) 5 MG tablet Take 1 tablet by mouth daily.   4   levothyroxine  (SYNTHROID ) 75 MCG tablet TAKE 1 TABLET BY MOUTH DAILY BEFORE BREAKFAST. 90 tablet 0   losartan  (COZAAR ) 100 MG tablet TAKE 1 TABLET BY MOUTH EVERY DAY 90 tablet 3   MITIGARE  0.6 MG CAPS TAKE 2 PILLS FOR GOUT ATTACK, THEN 1 PILL AN HOUR LATER 15 capsule 1   rosuvastatin  (CRESTOR ) 20 MG tablet TAKE 1 TABLET BY MOUTH EVERY DAY 90 tablet 1   SYMBICORT 80-4.5 MCG/ACT inhaler INHALE 2 PUFFS INTO THE LUNGS TWICE A DAY 30.6 each 12   traZODone  (DESYREL ) 50 MG tablet TAKE 1-2 TABLETS BY MOUTH AT BEDTIME AS NEEDED FOR SLEEP. 180 tablet 0   No current facility-administered medications for this encounter.    Atrial Fibrillation Management history:  Previous antiarrhythmic drugs: none Previous cardioversions: none Previous ablations: none Anticoagulation history: Eliquis    ROS- All systems are reviewed and negative except as per the HPI above.  Physical Exam: BP (!) 148/92   Pulse 85   Ht 5' 5 (1.651 m)   Wt 76.1 kg   BMI 27.92 kg/m  GEN- The patient is well appearing, alert and oriented x 3 today.   Neck - no JVD or carotid bruit noted Lungs- Clear to ausculation bilaterally, normal work of breathing Heart- Regular rate and rhythm, no murmurs, rubs or gallops, PMI not laterally displaced Extremities- no clubbing, cyanosis, or edema Skin - no rash or ecchymosis noted   EKG today demonstrates  EKG Interpretation Date/Time:  Friday January 21 2025 11:13:27 EST Ventricular Rate:  85 PR Interval:  180 QRS Duration:  74 QT Interval:  366 QTC Calculation: 435 R Axis:   9  Text Interpretation: Normal sinus rhythm Anterior infarct (cited on or before 16-Jul-2024) Abnormal ECG When compared with ECG of 16-Jul-2024 14:32, Premature atrial complexes are no longer Present Confirmed by Terra Pac ([id number]) on 01/21/2025 11:14:41 AM    Echo 09/01/2024:  1. Left ventricular ejection fraction, by estimation, is 65 to 70%. The  left ventricle has normal function. The left  ventricle has no regional  wall motion abnormalities. There is mild left ventricular hypertrophy.  Left ventricular diastolic parameters  are consistent with Grade I diastolic dysfunction (impaired relaxation).   2. Right ventricular systolic function is normal. The right ventricular  size is normal. There is normal pulmonary artery systolic pressure. The  estimated right ventricular systolic pressure is 32.8 mmHg.   3. The mitral valve is normal in structure. Trivial mitral valve  regurgitation. No evidence of mitral stenosis.   4. The aortic valve was not well visualized. Aortic valve regurgitation  is not visualized. No aortic stenosis is present.   5. The inferior vena cava is normal in size with greater than 50%  respiratory variability, suggesting right atrial pressure of 3 mmHg.   ASSESSMENT & PLAN CHA2DS2-VASc Score = 3  The patient's score is based upon: CHF History: 0 HTN History: 1 Diabetes History: 0 Stroke History: 0 Vascular Disease History: 0 Age Score: 2 Gender Score: 0       ASSESSMENT AND PLAN: Paroxysmal Atrial Fibrillation (ICD10:  I48.0) The patient's CHA2DS2-VASc score is 3, indicating a 3.2% annual risk of stroke.    Patient is currently in NSR.  He is happy with overall management and does not wish to make any changes at this time.  We will continue conservative observation.  Secondary Hypercoagulable State (ICD10:  D68.69) The patient is at significant risk for stroke/thromboembolism based upon his CHA2DS2-VASc Score of 3.  Continue Apixaban  (Eliquis ).  No bleeding issues with Eliquis .  Continue Eliquis  5 mg twice daily.  Dosage is correct based on weight greater than 60 kg and creatinine less than 1.5.    Follow up 1 year Afib clinic.    Terra Pac, PA-C  [facility] [address] [place], [place] [address] [phone]   "

## 2025-06-06 ENCOUNTER — Ambulatory Visit: Admitting: Family Medicine
